# Patient Record
Sex: Female | Born: 2000 | Hispanic: No | Marital: Single | Smoking: Never smoker
Health system: Southern US, Community
[De-identification: ages and names within clinical notes are randomized; demographics above are authoritative.]

## PROBLEM LIST (undated history)

## (undated) ENCOUNTER — Inpatient Hospital Stay: Payer: Self-pay

## (undated) DIAGNOSIS — Z8639 Personal history of other endocrine, nutritional and metabolic disease: Secondary | ICD-10-CM

## (undated) HISTORY — PX: NO PAST SURGERIES: SHX2092

## (undated) HISTORY — DX: Personal history of other endocrine, nutritional and metabolic disease: Z86.39

---
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
## 2022-09-23 IMAGING — DX DG ABDOMEN 1V
2 series · 2 of 2 positions shown · non-contrast
Comparison: CT 01/11/2021

CLINICAL DATA: Mid abdominal pain and vomiting starting 2 days ago.

EXAM:
ABDOMEN - 1 VIEW

[abdomen supine (1 of 2)]
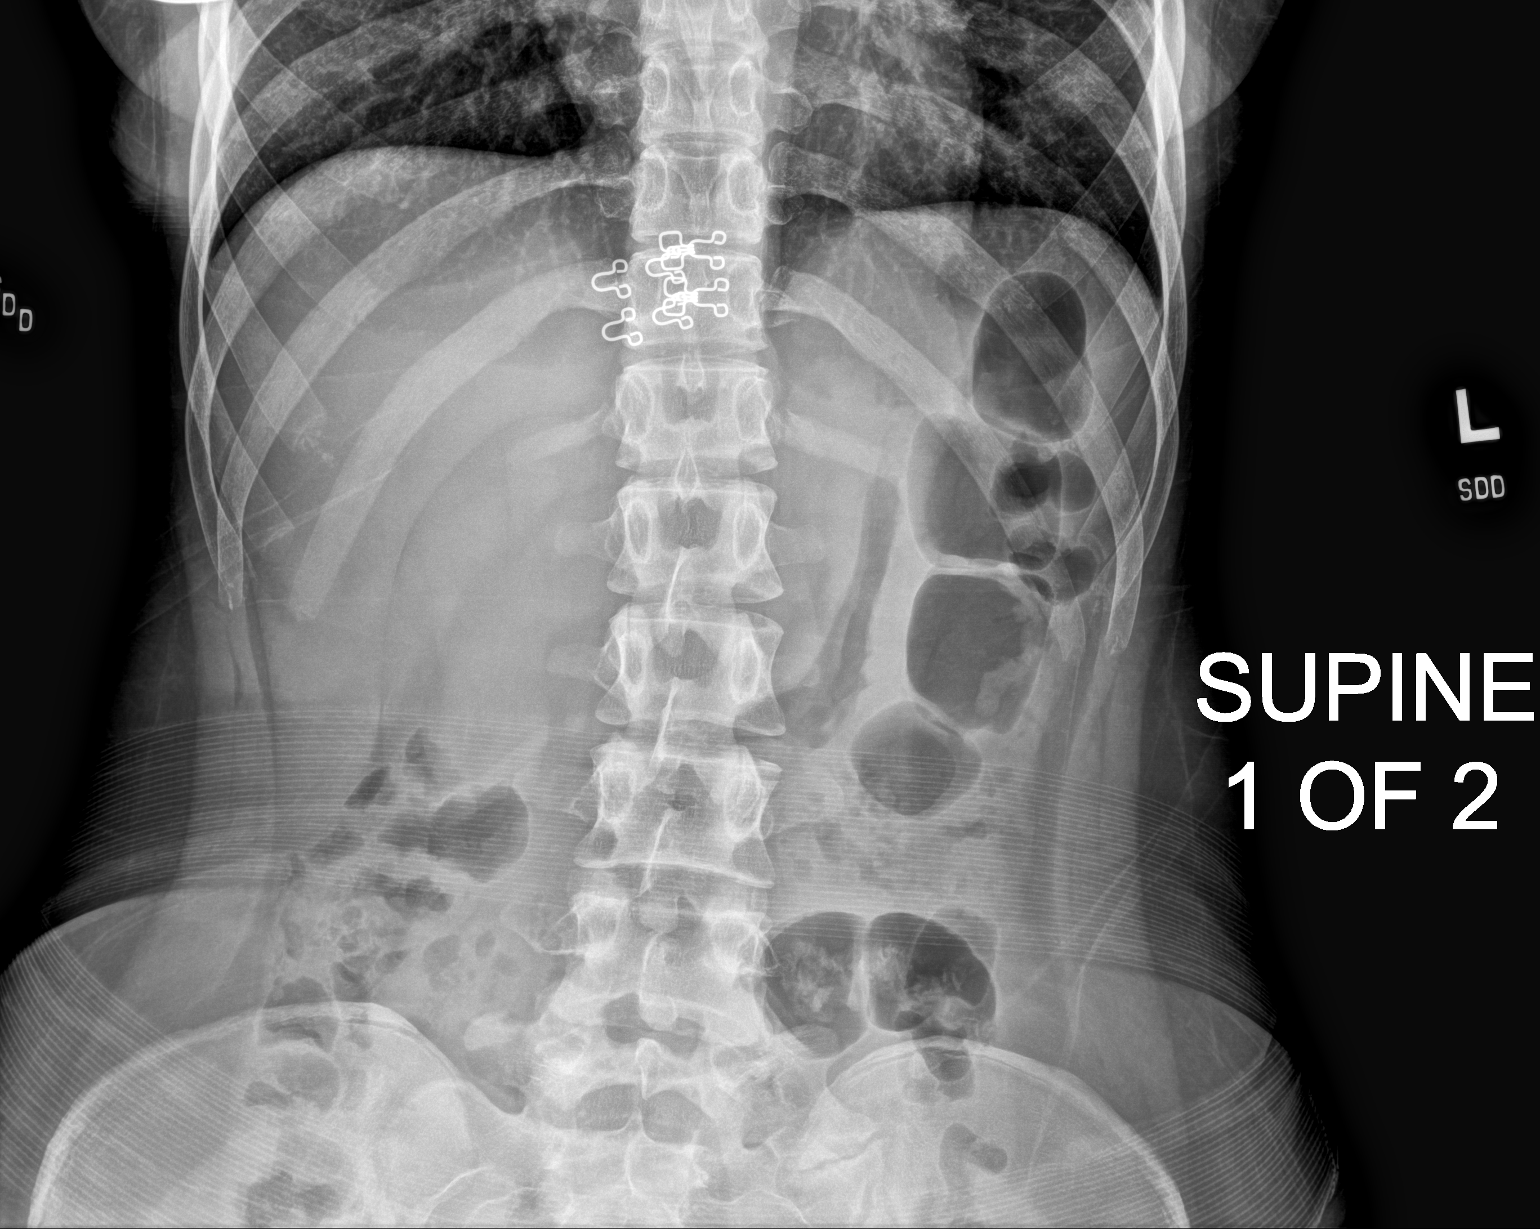

[abdomen supine (2 of 2)]
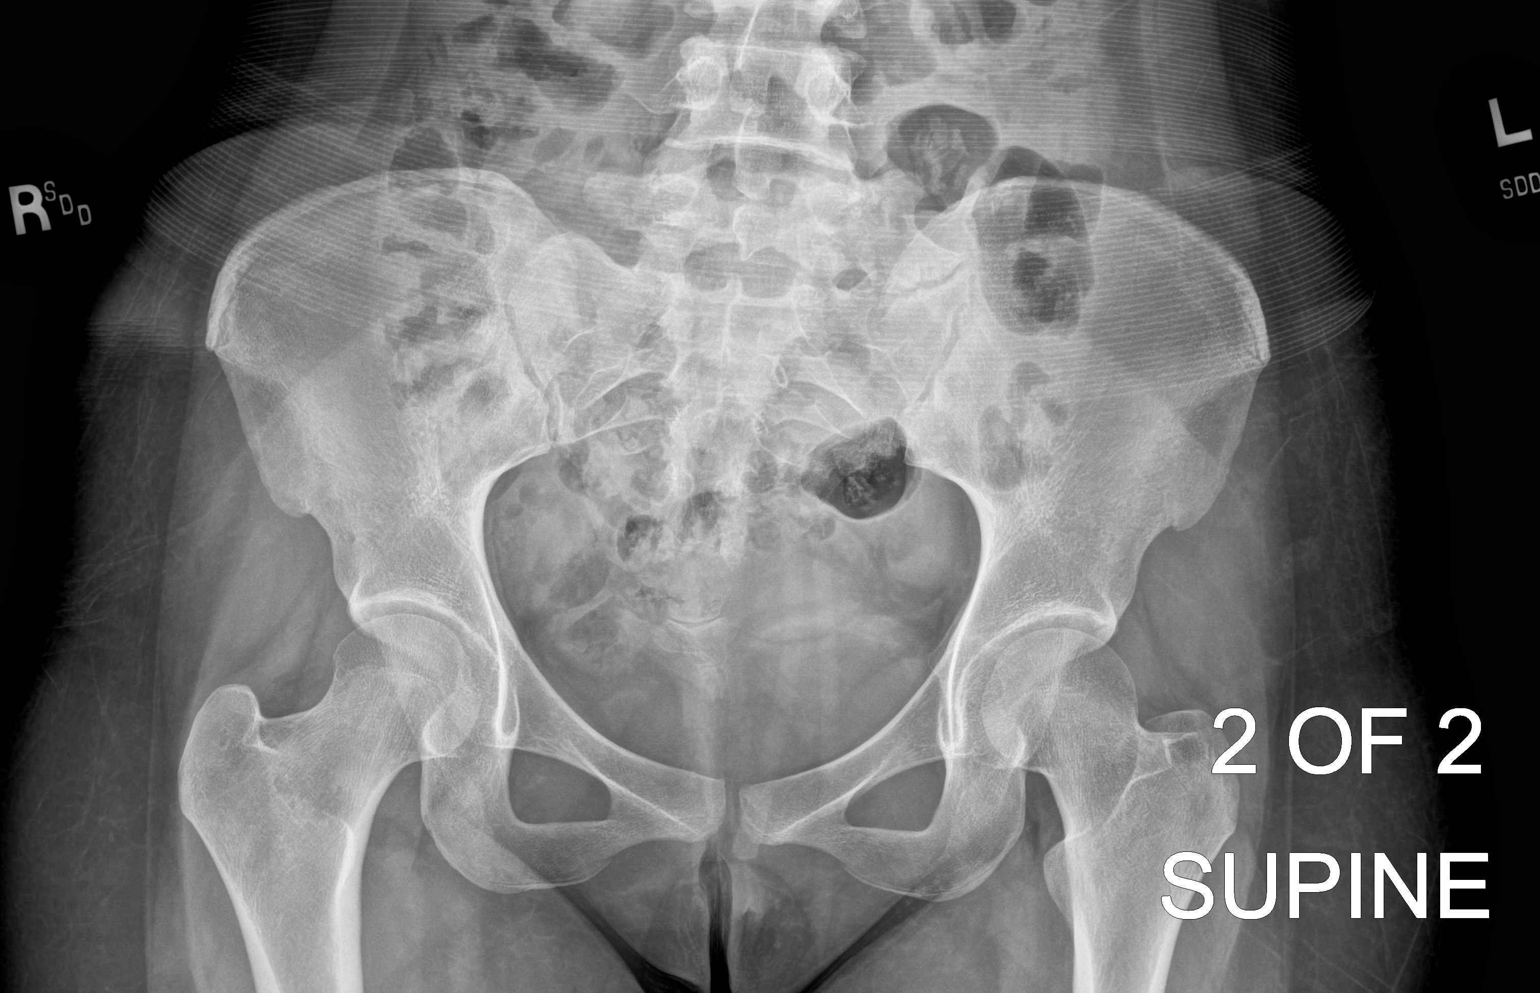

[2 of 2 positions shown; findings below may reference images not displayed]

FINDINGS: Gas and stool throughout the colon. No small or large bowel
distention. No radiopaque stones. Visualized bones and soft tissue
contours appear intact. Visualized lung bases are clear.
IMPRESSION: Normal nonobstructive bowel gas pattern.

## 2023-06-28 NOTE — L&D Delivery Note (Signed)
 Delivery Note   Kristina English is a 23 y.o. G1P0 at [redacted]w[redacted]d Estimated Date of Delivery: 04/09/24  PRE-OPERATIVE DIAGNOSIS:  1) [redacted]w[redacted]d pregnancy 2) Late entry to prenatal care  POST-OPERATIVE DIAGNOSIS:  1) [redacted]w[redacted]d pregnancy s/p Vaginal, Spontaneous 2) Late entry to prenatal care  Delivery Type: Vaginal, Spontaneous   Delivery Anesthesia: None  Labor Complications: Prolonged latent phase    ESTIMATED BLOOD LOSS: 150 ml    FINDINGS:   Information for the patient's newborn:  Hewitt, Girl Zaylia [[id number]]  Live born female  Birth Weight: 7 lb 8.3 oz (3410 g) APGAR: 6, 9  Newborn Delivery   Birth date/time: 04/12/2024 17:20:00 Delivery type: Vaginal, Spontaneous      SPECIMENS:   PLACENTA:   Appearance: Intact   Removal: Spontaneous     Disposition: discarded  Cord Blood: not collected, mother of baby A POS Performed at [facility], [address] [place], [place] [address]   DISPOSITION:  Infant left in stable condition in the delivery room, with L&D personnel and mother,  NARRATIVE SUMMARY: Labor course:  [name] is a G1P0 at [redacted]w[redacted]d who presented to Labor & Delivery for labor management. Her initial cervical exam was 4/80/-2. Labor proceeded with augmentation with AROM and pitocin and she was found to be completely dilated at 1610. With excellent maternal pushing effort, she birthed a viable female infant at 31. Head birthed ROA, restituted to ROT. There was a nuchal cord x 1 and reduced before delivery. The shoulders were birthed without difficulty. The infant was placed skin-to-skin with [name]. Pitocin was bolused in IV fluid for third stage management. The placenta delivered spontaneously and was noted to be intact with a 3VC. Cord was doubly clamped and cut by Dorina's mother. A perineal and vaginal examination was performed. Episiotomy/Lacerations: Bilateral Labial;Vaginal Lacerations were repaired with Vicryl suture using 1% lidocaine without  epinephrine anesthesia. Tolerated well. Mother and baby left in stable condition.  Lauraine Lakes, CNM 04/12/2024 5:59 PM

## 2023-08-28 DIAGNOSIS — Z789 Other specified health status: Secondary | ICD-10-CM | POA: Insufficient documentation

## 2023-09-28 ENCOUNTER — Other Ambulatory Visit: Payer: Self-pay | Admitting: Family Medicine

## 2023-09-28 DIAGNOSIS — Z3401 Encounter for supervision of normal first pregnancy, first trimester: Secondary | ICD-10-CM

## 2023-11-24 ENCOUNTER — Emergency Department

## 2023-11-24 ENCOUNTER — Other Ambulatory Visit: Payer: Self-pay

## 2023-11-24 ENCOUNTER — Encounter: Payer: Self-pay | Admitting: Emergency Medicine

## 2023-11-24 ENCOUNTER — Emergency Department
Admission: EM | Admit: 2023-11-24 | Discharge: 2023-11-25 | Disposition: A | Discharge status: Home / Self Care | Attending: Emergency Medicine | Admitting: Emergency Medicine

## 2023-11-24 DIAGNOSIS — O99891 Other specified diseases and conditions complicating pregnancy: Secondary | ICD-10-CM | POA: Insufficient documentation

## 2023-11-24 DIAGNOSIS — Z3492 Encounter for supervision of normal pregnancy, unspecified, second trimester: Secondary | ICD-10-CM

## 2023-11-24 DIAGNOSIS — Z3A19 19 weeks gestation of pregnancy: Secondary | ICD-10-CM | POA: Diagnosis not present

## 2023-11-24 DIAGNOSIS — R1013 Epigastric pain: Secondary | ICD-10-CM | POA: Insufficient documentation

## 2023-11-24 DIAGNOSIS — O2342 Unspecified infection of urinary tract in pregnancy, second trimester: Secondary | ICD-10-CM | POA: Insufficient documentation

## 2023-11-24 DIAGNOSIS — R079 Chest pain, unspecified: Secondary | ICD-10-CM | POA: Insufficient documentation

## 2023-11-24 DIAGNOSIS — N390 Urinary tract infection, site not specified: Secondary | ICD-10-CM

## 2023-11-24 LAB — CBC
HCT: 35 % — ABNORMAL LOW (ref 36.0–46.0)
Hemoglobin: 11.8 g/dL — ABNORMAL LOW (ref 12.0–15.0)
MCH: 30.7 pg (ref 26.0–34.0)
MCHC: 33.7 g/dL (ref 30.0–36.0)
MCV: 91.1 fL (ref 80.0–100.0)
Platelets: 304 10*3/uL (ref 150–400)
RBC: 3.84 MIL/uL — ABNORMAL LOW (ref 3.87–5.11)
RDW: 13.3 % (ref 11.5–15.5)
WBC: 11.1 10*3/uL — ABNORMAL HIGH (ref 4.0–10.5)
nRBC: 0 % (ref 0.0–0.2)

## 2023-11-24 LAB — BASIC METABOLIC PANEL WITH GFR
Anion gap: 8 (ref 5–15)
BUN: 5 mg/dL — ABNORMAL LOW (ref 6–20)
CO2: 22 mmol/L (ref 22–32)
Calcium: 9.3 mg/dL (ref 8.9–10.3)
Chloride: 105 mmol/L (ref 98–111)
Creatinine, Ser: 0.44 mg/dL (ref 0.44–1.00)
GFR, Estimated: >60 mL/min (ref >60)
Glucose, Bld: 82 mg/dL (ref 70–99)
Potassium: 3.4 mmol/L — ABNORMAL LOW (ref 3.5–5.1)
Sodium: 135 mmol/L (ref 135–145)

## 2023-11-24 LAB — TROPONIN I (HIGH SENSITIVITY): Troponin I (High Sensitivity): 3 ng/L (ref <18)

## 2023-11-24 MED ORDER — FAMOTIDINE IN NACL 20-0.9 MG/50ML-% IV SOLN
20.0000 mg | Freq: Once | INTRAVENOUS | Status: AC
  Administered 2023-11-25: 20 mg via INTRAVENOUS
  Filled 2023-11-24: qty 50

## 2023-11-24 NOTE — ED Triage Notes (Signed)
 Pt presents recently found out she was pregnant. For the past two days has had chest pressure underneath both breasts which is keeping her form sleeping.  Has had some nausea and vomiting.  LMP 1/7.

## 2023-11-24 NOTE — ED Provider Notes (Signed)
 [facility] Provider Note    Event Date/Time   First MD Initiated Contact with Patient 11/24/23 2326     (approximate)   History   Chest Pain and Pregnancy   HPI  Kristina English is a 22 y.o. female G1, P0 unknown dates, LMP in January who recently found out she was pregnant complaining of a 2-day history of pressure beneath her xiphoid process/epigastrium, waxing/waning unrelated to food.  Endorses some nausea/vomiting prior to onset of discomfort.  Denies fever/chills, shortness of breath, dysuria, vaginal bleeding or diarrhea.     Past Medical History  History reviewed. No pertinent past medical history.   Active Problem List  There are no active problems to display for this patient.    Past Surgical History  History reviewed. No pertinent surgical history.   Home Medications   Prior to Admission medications   Medication Sig Start Date End Date Taking? Authorizing Provider  dicyclomine  (BENTYL ) 10 MG capsule Take 1 capsule (10 mg total) by mouth 4 (four) times daily for 14 days. 04/06/21 04/20/21  Isa Manuel, MD  ondansetron  (ZOFRAN  ODT) 4 MG disintegrating tablet Take 1 tablet (4 mg total) by mouth every 8 (eight) hours as needed. 04/06/21   Isa Manuel, MD  potassium chloride  SA (KLOR-CON  M20) 20 MEQ tablet Take 1 tablet (20 mEq total) by mouth daily. 01/11/21   Lynnda Sas, MD     Allergies  Patient has no known allergies.   Family History  History reviewed. No pertinent family history.   Physical Exam  Triage Vital Signs: ED Triage Vitals  Encounter Vitals Group     BP 11/24/23 2145 114/69     Systolic BP Percentile --      Diastolic BP Percentile --      Pulse Rate 11/24/23 2145 77     Resp 11/24/23 2145 18     Temp 11/24/23 2146 97.8 F (36.6 C)     Temp Source 11/24/23 2146 Oral     SpO2 11/24/23 2145 100 %     Weight --      Height --      Head Circumference --      Peak Flow --      Pain Score  11/24/23 2142 4     Pain Loc --      Pain Education --      Exclude from Growth Chart --     Updated Vital Signs: BP (!) 116/56   Pulse (!) 101   Temp 97.8 F (36.6 C) (Oral)   Resp 20   SpO2 100%    General: Awake, no distress.  CV:  RRR.  Good peripheral perfusion.  Resp:  Normal effort.  CTAB. Abd:  Mild tenderness to epigastrium without rebound or guarding.  No distention.  Other:  No truncal vesicles.   ED Results / Procedures / Treatments  Labs (all labs ordered are listed, but only abnormal results are displayed) Labs Reviewed  BASIC METABOLIC PANEL WITH GFR - Abnormal; Notable for the following components:      Result Value   Potassium 3.4 (*)    BUN 5 (*)    All other components within normal limits  CBC - Abnormal; Notable for the following components:   WBC 11.1 (*)    RBC 3.84 (*)    Hemoglobin 11.8 (*)    HCT 35.0 (*)    All other components within normal limits  HCG, QUANTITATIVE, PREGNANCY - Abnormal; Notable for the following  components:   hCG, Beta Chain, Quant, [place] 16,109 (*)    All other components within normal limits  HEPATIC FUNCTION PANEL - Abnormal; Notable for the following components:   Albumin 3.3 (*)    All other components within normal limits  URINALYSIS, ROUTINE W REFLEX MICROSCOPIC - Abnormal; Notable for the following components:   Color, Urine STRAW (*)    APPearance HAZY (*)    Specific Gravity, Urine 1.002 (*)    Leukocytes,Ua LARGE (*)    Bacteria, UA MANY (*)    All other components within normal limits  URINE CULTURE  LIPASE, BLOOD  TROPONIN I (HIGH SENSITIVITY)  TROPONIN I (HIGH SENSITIVITY)     EKG  ED ECG REPORT I, Tyran Huser J, the attending physician, personally viewed and interpreted this ECG.   Date: 11/25/2023  EKG Time: 2145  Rate: 77  Rhythm: normal sinus rhythm  Axis: Normal  Intervals:none  ST&T Change: Nonspecific    RADIOLOGY I have independently visualized and interpreted patient's imaging  studies as well as noted the radiology interpretation:  Chest x-ray: No acute cardiopulmonary process  US  OB limited: IUP 19-week 1 day, closed cervix  RUQ ultrasound: No acute findings  Official radiology report(s): US  ABDOMEN LIMITED RUQ (LIVER/GB) Result Date: 11/25/2023 EXAM: Right Upper Quadrant Abdominal Ultrasound 11/25/2023 12:13:35 AM TECHNIQUE: Real-time ultrasonography of the right upper quadrant of the abdomen was performed. COMPARISON: None available. CLINICAL HISTORY: Pain. FINDINGS: LIVER: The liver demonstrates normal echogenicity. No intrahepatic biliary ductal dilatation. No evidence of mass. BILIARY SYSTEM: No pericholecystic fluid or wall thickening. No cholelithiasis. Negative sonographic Murphy's sign. Common bile duct is within normal limits measuring 3 mm. OTHER: No right upper quadrant ascites. IMPRESSION: 1. No acute findings. Electronically signed by: Zadie Herter MD 11/25/2023 12:28 AM EDT RP Workstation: [id number]   US  OB Limited Result Date: 11/25/2023 EXAM: ULTRASOUND OB LIMITED CLINICAL HISTORY: Pain. TECHNIQUE: Transabdominal obstetric pelvic ultrasound was performed with color Doppler flow evaluation. COMPARISON: None provided. FINDINGS: FETUS: A single live intrauterine pregnancy is present, estimated gestational age [redacted] weeks 1 day. POSITION: The fetus position is cephalic. HEART RATE: Fetal heart rate measures 150 beats per minute. PLACENTA: The placenta is located anterior. No placenta previa. AMNIOTIC FLUID: The amniotic fluid volume is normal. BIOMETRICS: BPD measures 4.4 cm (19 weeks, 1 day). CERVIX: Cervix closed, measuring 4 cm. IMPRESSION: 1. Single live intrauterine pregnancy with gestational age of [redacted] weeks 1 day by BPD. 2. Closed cervix measuring 4 cm. Electronically signed by: Zadie Herter MD 11/25/2023 12:28 AM EDT RP Workstation: [id number]   DG Chest 2 View Result Date: 11/24/2023 CLINICAL DATA:  Chest pain. Nausea and vomiting. Recently  found out she was pregnant. EXAM: CHEST - 2 VIEW COMPARISON:  None Available. FINDINGS: The heart size and mediastinal contours are within normal limits. Both lungs are clear. The visualized skeletal structures are unremarkable. IMPRESSION: No active cardiopulmonary disease. Electronically Signed   By: Boyce Byes M.D.   On: 11/24/2023 22:24     PROCEDURES:  Critical Care performed: No  Procedures   MEDICATIONS ORDERED IN ED: Medications  famotidine  (PEPCID ) IVPB 20 mg premix (0 mg Intravenous Stopped 11/25/23 0056)  cefTRIAXone (ROCEPHIN) 1 g in sodium chloride 0.9 % 100 mL IVPB (1 g Intravenous New Bag/Given 11/25/23 0123)     IMPRESSION / MDM / ASSESSMENT AND PLAN / ED COURSE  I reviewed the triage vital signs and the nursing notes.  23 year old female G1, P0 unknown dates presenting with epigastric/substernal chest pressure. Differential diagnosis includes, but is not limited to, ACS, aortic dissection, pulmonary embolism, cardiac tamponade, pneumothorax, pneumonia, pericarditis, myocarditis, GI-related causes including esophagitis/gastritis, and musculoskeletal chest wall pain.   I personally reviewed patient's records and note an urgent care visit from 11/02/2023 for abdominal cramping and pregnancy.  Patient's presentation is most consistent with acute complicated illness / injury requiring diagnostic workup.  Laboratory results unremarkable.  Will check LFTs/lipase, UA.  Send for OB and RUQ ultrasounds.  Administer IV Pepcid  and reassess.  Clinical Course as of 11/25/23 0205  Sat Nov 25, 2023  0203 Ultrasound abdomen negative.  OB ultrasound demonstrates IUP 19-week 1 day.  LFT/lipase unremarkable.  Large leukocyte positive UTI.  Will place on Keflex and patient will follow-up with OB/GYN closely.  Strict return precautions given.  Patient verbalizes understanding and agrees with plan of care. [JS]    Clinical Course User Index [JS] Norlene Beavers,  MD     FINAL CLINICAL IMPRESSION(S) / ED DIAGNOSES   Final diagnoses:  Nonspecific chest pain  Second trimester pregnancy  Urinary tract infection without hematuria, site unspecified     Rx / DC Orders   ED Discharge Orders     None        Note:  This document was prepared using Dragon voice recognition software and may include unintentional dictation errors.   Murry Khiev J, MD 11/25/23 [phone]

## 2023-11-25 LAB — URINALYSIS, ROUTINE W REFLEX MICROSCOPIC
Bilirubin Urine: NEGATIVE
Glucose, UA: NEGATIVE mg/dL
Hgb urine dipstick: NEGATIVE
Ketones, ur: NEGATIVE mg/dL
Nitrite: NEGATIVE
Protein, ur: NEGATIVE mg/dL
Specific Gravity, Urine: 1.002 — ABNORMAL LOW (ref 1.005–1.030)
pH: 6 (ref 5.0–8.0)

## 2023-11-25 LAB — HEPATIC FUNCTION PANEL
ALT: 13 U/L (ref 0–44)
AST: 20 U/L (ref 15–41)
Albumin: 3.3 g/dL — ABNORMAL LOW (ref 3.5–5.0)
Alkaline Phosphatase: 44 U/L (ref 38–126)
Bilirubin, Direct: <0.1 mg/dL (ref 0.0–0.2)
Total Bilirubin: 0.4 mg/dL (ref 0.0–1.2)
Total Protein: 6.9 g/dL (ref 6.5–8.1)

## 2023-11-25 LAB — LIPASE, BLOOD: Lipase: 28 U/L (ref 11–51)

## 2023-11-25 LAB — TROPONIN I (HIGH SENSITIVITY): Troponin I (High Sensitivity): 4 ng/L (ref <18)

## 2023-11-25 LAB — HCG, QUANTITATIVE, PREGNANCY: hCG, Beta Chain, Quant, S: 36945 m[IU]/mL — ABNORMAL HIGH (ref <5)

## 2023-11-25 MED ORDER — ONDANSETRON 4 MG PO TBDP: 4.0000 mg | ORAL_TABLET | Freq: Three times a day (TID) | ORAL | 0 refills | Status: DC | PRN

## 2023-11-25 MED ORDER — CEPHALEXIN 500 MG PO CAPS: 500.0000 mg | ORAL_CAPSULE | Freq: Three times a day (TID) | ORAL | 0 refills | Status: DC

## 2023-11-25 MED ORDER — SODIUM CHLORIDE 0.9 % IV SOLN
1.0000 g | Freq: Once | INTRAVENOUS | Status: AC
  Administered 2023-11-25: 1 g via INTRAVENOUS
  Filled 2023-11-25: qty 10

## 2023-11-25 NOTE — Discharge Instructions (Signed)
 Take and finish antibiotic as prescribed.  You may take Zofran  as needed for nausea or vomiting.  Drink plenty of fluids daily.  Return to the ER for worsening symptoms, persistent vomiting or other concerns.

## 2023-11-26 LAB — URINE CULTURE: Culture: <10000 — AB

## 2023-12-01 ENCOUNTER — Telehealth

## 2023-12-01 DIAGNOSIS — Z3490 Encounter for supervision of normal pregnancy, unspecified, unspecified trimester: Secondary | ICD-10-CM | POA: Insufficient documentation

## 2023-12-01 DIAGNOSIS — Z3401 Encounter for supervision of normal first pregnancy, first trimester: Secondary | ICD-10-CM | POA: Insufficient documentation

## 2023-12-01 DIAGNOSIS — Z3689 Encounter for other specified antenatal screening: Secondary | ICD-10-CM | POA: Diagnosis not present

## 2023-12-01 DIAGNOSIS — Z3400 Encounter for supervision of normal first pregnancy, unspecified trimester: Secondary | ICD-10-CM | POA: Insufficient documentation

## 2023-12-01 DIAGNOSIS — Z3480 Encounter for supervision of other normal pregnancy, unspecified trimester: Secondary | ICD-10-CM | POA: Insufficient documentation

## 2023-12-01 DIAGNOSIS — O0930 Supervision of pregnancy with insufficient antenatal care, unspecified trimester: Secondary | ICD-10-CM | POA: Insufficient documentation

## 2023-12-01 NOTE — Patient Instructions (Signed)
 Second Trimester of Pregnancy  The second trimester of pregnancy is from week 14 through week 27. This is months 4 through 6 of pregnancy. During the second trimester: Morning sickness is less or has stopped. You may have more energy. You may feel hungry more often. At this time, your unborn baby is growing very fast. At the end of the sixth month, the unborn baby may be up to 12 inches long and weigh about 1 pounds. You will likely start to feel the baby move between 16 and 20 weeks of pregnancy. Body changes during your second trimester Your body continues to change during this time. The changes usually go away after your baby is born. Physical changes You will gain more weight. Your belly will get bigger. You may begin to get stretch marks on your hips, belly, and breasts. Your breasts will keep growing and may hurt. You may get dark spots or blotches on your face. A dark line from your belly button to the pubic area may appear. This line is called linea nigra. Your hair may grow faster and get thicker. Health changes You may have headaches. You may have heartburn. You may pee more often. You may have swollen, bulging veins (varicose veins). You may have trouble pooping (constipation), or swollen veins in the butt that can itch or get painful (hemorrhoids). You may have back pain. This is caused by: Weight gain. Pregnancy hormones that are relaxing the joints in your pelvis. Follow these instructions at home: Medicines Talk to your health care provider if you're taking medicines. Ask if the medicines are safe to take during pregnancy. Your provider may change the medicines that you take. Do not take any medicines unless told to by your provider. Take a prenatal vitamin that has at least 600 micrograms (mcg) of folic acid. Do not use herbal medicines, illegal drugs, or medicines that are not approved by your provider. Eating and drinking While you're pregnant your body needs  extra food for your growing baby. Talk with your provider about what to eat while pregnant. Activity Most women are able to exercise during pregnancy. Exercises may need to change as your pregnancy goes on. Talk to your provider about your activities and exercise routines. Relieving pain and discomfort Wear a good, supportive bra if your breasts hurt. Rest with your legs raised if you have leg cramps or low back pain. Take warm sitz baths to soothe pain from hemorrhoids. Use hemorrhoid cream if your provider says it's okay. Do not douche. Do not use tampons or scented pads. Do not use hot tubs, steam rooms, or saunas. Safety Wear your seatbelt at all times when you're in a car. Talk to your provider if someone hits you, hurts you, or yells at you. Talk with your provider if you're feeling sad or have thoughts of hurting yourself. Lifestyle Certain things can be harmful while you're pregnant. It's best to avoid the following: Do not drink alcohol,smoke, vape, or use products with nicotine or tobacco in them. If you need help quitting, talk with your provider. Avoid cat litter boxes and soil used by cats. These things carry germs that can cause harm to your pregnancy and your baby. General instructions Keep all follow-up visits. It helps you and your unborn baby stay as healthy as possible. Write down your questions. Take them to your prenatal visits. Your provider will: Talk with you about your overall health. Give you advice or refer you to specialists who can help with different needs,  including: Prenatal education classes. Mental health and counseling. Foods and healthy eating. Ask for help if you need help with food. Where to find more information American Pregnancy Association: americanpregnancy.org [employer] of [employer]etricians and Gynecologists: acog.org Office on [employer] Health: TravelLess[employer] Contact a health care provider if: You have a headache that does not go away  when you take medicine. You have any of these problems: You can't eat or drink. You throw up or feel like you may throw up. You have watery poop (diarrhea) for 2 days or more. You have pain when you pee or your pee smells bad. You have been sick for 2 days or more and are not getting better. Contact your provider right away if: You have any of these coming from your vagina: Abnormal discharge. Bad-smelling fluid. Bleeding. Your baby is moving less than usual. You have contractions, belly cramping, or have pain in your pelvis or lower back. You have symptoms of high blood pressure or preeclampsia. These include: A severe, throbbing headache that does not go away. Sudden or extreme swelling of your face, hands, legs, or feet. Vision problems: You see spots. You have blurry vision. Your eyes are sensitive to light. If you can't reach the provider, go to an urgent care or emergency room. Get help right away if: You faint, become confused, or can't think clearly. You have chest pain or trouble breathing. You have any kind of injury, such as from a fall or a car crash. These symptoms may be an emergency. Call 911 right away. Do not wait to see if the symptoms will go away. Do not drive yourself to the hospital. This information is not intended to replace advice given to you by your health care provider. Make sure you discuss any questions you have with your health care provider. Document Revised: 03/16/2023 Document Reviewed: 10/14/2022 Elsevier Patient Education  2024 Elsevier Inc.   Common Medications Safe in Pregnancy  Acne:      Constipation:  Benzoyl Peroxide     Colace  Clindamycin      Dulcolax Suppository  Topica Erythromycin     Fibercon  Salicylic Acid      Metamucil         Miralax AVOID:        Senakot   Accutane    Cough:  Retin-A       Cough Drops  Tetracycline      Phenergan w/ Codeine if Rx  Minocycline      Robitussin (Plain &  DM)  Antibiotics:     Crabs/Lice:  Ceclor       RID  Cephalosporins    AVOID:  E-Mycins      Kwell  Keflex  Macrobid/Macrodantin   Diarrhea:  Penicillin      Kao-Pectate  Zithromax      Imodium AD         PUSH FLUIDS AVOID:       Cipro     Fever:  Tetracycline      Tylenol (Regular or Extra  Minocycline       Strength)  Levaquin      Extra Strength-Do not          Exceed 8 tabs/24 hrs Caffeine:        200mg /day (equiv. To 1 cup of coffee or  approx. 3 12 oz sodas)         Gas: Cold/Hayfever:       Gas-X  Benadryl      Mylicon  Claritin       Phazyme  **Claritin-D        Chlor-Trimeton    Headaches:  Dimetapp      ASA-Free Excedrin  Drixoral-Non-Drowsy     Cold Compress  Mucinex (Guaifenasin)     Tylenol (Regular or Extra  Sudafed/Sudafed-12 Hour     Strength)  **Sudafed PE Pseudoephedrine   Tylenol Cold & Sinus     Vicks Vapor Rub  Zyrtec  **AVOID if Problems With Blood Pressure         Heartburn: Avoid lying down for at least 1 hour after meals  Aciphex      Maalox     Rash:  Milk of Magnesia     Benadryl    Mylanta       1% Hydrocortisone Cream  Pepcid  Pepcid Complete   Sleep Aids:  Prevacid      Ambien   Prilosec       Benadryl  Rolaids       Chamomile Tea  Tums (Limit 4/day)     Unisom         Tylenol PM         Warm milk-add vanilla or  Hemorrhoids:       Sugar for taste  Anusol/Anusol H.C.  (RX: Analapram 2.5%)  Sugar Substitutes:  Hydrocortisone OTC     Ok in moderation  Preparation H      Tucks        Vaseline lotion applied to tissue with wiping    Herpes:     Throat:  Acyclovir      Oragel  Famvir  Valtrex     Vaccines:         Flu Shot Leg Cramps:       *Gardasil  Benadryl      Hepatitis A         Hepatitis B Nasal Spray:       Pneumovax  Saline Nasal Spray     Polio Booster         Tetanus Nausea:       Tuberculosis test or PPD  Vitamin B6 25 mg TID   AVOID:    Dramamine      *Gardasil  Emetrol       Live Poliovirus  Ginger  Root 250 mg QID    MMR (measles, mumps &  High Complex Carbs @ Bedtime    rebella)  Sea Bands-Accupressure    Varicella (Chickenpox)  Unisom 1/2 tab TID     *No known complications           If received before Pain:         Known pregnancy;   Darvocet       Resume series after  Lortab        Delivery  Percocet    Yeast:   Tramadol      Femstat  Tylenol 3      Gyne-lotrimin  Ultram       Monistat  Vicodin           MISC:         All Sunscreens           Hair Coloring/highlights          Insect Repellant's          (Including DEET)         Mystic Tans   Commonly Asked Questions During Pregnancy  Cats: A parasite can be excreted in cat feces.  To avoid exposure you need to have another person empty the little box.  If you must empty the litter box you will need to wear gloves.  Wash your hands after handling your cat.  This parasite can also be found in raw or undercooked meat so this should also be avoided.  Colds, Sore Throats, Flu: Please check your medication sheet to see what you can take for symptoms.  If your symptoms are unrelieved by these medications please call the office.  Dental Work: Most any dental work Agricultural consultant recommends is permitted.  X-rays should only be taken during the first trimester if absolutely necessary.  Your abdomen should be shielded with a lead apron during all x-rays.  Please notify your provider prior to receiving any x-rays.  Novocaine is fine; gas is not recommended.  If your dentist requires a note from [place] prior to dental work please call the office and we will provide one for you.  Exercise: Exercise is an important part of staying healthy during your pregnancy.  You may continue most exercises you were accustomed to prior to pregnancy.  Later in your pregnancy you will most likely notice you have difficulty with activities requiring balance like riding a bicycle.  It is important that you listen to your body and avoid activities that put you at a  higher risk of falling.  Adequate rest and staying well hydrated are a must!  If you have questions about the safety of specific activities ask your provider.    Exposure to Children with illness: Try to avoid obvious exposure; report any symptoms to [place] when noted,  If you have chicken pos, red measles or mumps, you should be immune to these diseases.   Please do not take any vaccines while pregnant unless you have checked with your OB provider.  Fetal Movement: After 28 weeks we recommend you do "kick counts" twice daily.  Lie or sit down in a calm quiet environment and count your baby movements "kicks".  You should feel your baby at least 10 times per hour.  If you have not felt 10 kicks within the first hour get up, walk around and have something sweet to eat or drink then repeat for an additional hour.  If count remains less than 10 per hour notify your provider.  Fumigating: Follow your pest control agent's advice as to how long to stay out of your home.  Ventilate the area well before re-entering.  Hemorrhoids:   Most over-the-counter preparations can be used during pregnancy.  Check your medication to see what is safe to use.  It is important to use a stool softener or fiber in your diet and to drink lots of liquids.  If hemorrhoids seem to be getting worse please call the office.   Hot Tubs:  Hot tubs Jacuzzis and saunas are not recommended while pregnant.  These increase your internal body temperature and should be avoided.  Intercourse:  Sexual intercourse is safe during pregnancy as long as you are comfortable, unless otherwise advised by your provider.  Spotting may occur after intercourse; report any bright red bleeding that is heavier than spotting.  Labor:  If you know that you are in labor, please go to the hospital.  If you are unsure, please call the office and let us help you decide what to do.  Lifting, straining, etc:  If your job requires heavy lifting or straining please check  with your provider for any limitations.  Generally, you should  not lift items heavier than that you can lift simply with your hands and arms (no back muscles)  Painting:  Paint fumes do not harm your pregnancy, but may make you ill and should be avoided if possible.  Latex or water based paints have less odor than oils.  Use adequate ventilation while painting.  Permanents & Hair Color:  Chemicals in hair dyes are not recommended as they cause increase hair dryness which can increase hair loss during pregnancy.  " Highlighting" and permanents are allowed.  Dye may be absorbed differently and permanents may not hold as well during pregnancy.  Sunbathing:  Use a sunscreen, as skin burns easily during pregnancy.  Drink plenty of fluids; avoid over heating.  Tanning Beds:  Because their possible side effects are still unknown, tanning beds are not recommended.  Ultrasound Scans:  Routine ultrasounds are performed at approximately 20 weeks.  You will be able to see your baby's general anatomy an if you would like to know the gender this can usually be determined as well.  If it is questionable when you conceived you may also receive an ultrasound early in your pregnancy for dating purposes.  Otherwise ultrasound exams are not routinely performed unless there is a medical necessity.  Although you can request a scan we ask that you pay for it when conducted because insurance does not cover " patient request" scans.  Work: If your pregnancy proceeds without complications you may work until your due date, unless your physician or employer advises otherwise.  Round Ligament Pain/Pelvic Discomfort:  Sharp, shooting pains not associated with bleeding are fairly common, usually occurring in the second trimester of pregnancy.  They tend to be worse when standing up or when you remain standing for long periods of time.  These are the result of pressure of certain pelvic ligaments called "round ligaments".  Rest,  Tylenol and heat seem to be the most effective relief.  As the womb and fetus grow, they rise out of the pelvis and the discomfort improves.  Please notify the office if your pain seems different than that described.  It may represent a more serious condition.

## 2023-12-01 NOTE — Progress Notes (Signed)
 New OB Intake  I connected with  Kristina English on 12/01/23 at  2:35 PM EDT by MyChart Video Visit and verified that I am speaking with the correct person using two identifiers. Nurse is located at [facility] and pt is located at home.  I discussed the limitations, risks, security and privacy concerns of performing an evaluation and management service by telephone and the availability of in person appointments. I also discussed with the patient that there may be a patient responsible charge related to this service. The patient expressed understanding and agreed to proceed.  I explained I am completing New OB Intake today. We discussed her EDD of 04/18/24 that is based on Ultrasound. Pt is G1/P0. I reviewed her allergies, medications, Medical/Surgical/OB history, and appropriate screenings. There are cats in the home: no. Based on history, this is a/an pregnancy uncomplicated . Her obstetrical history is significant for N/A.  Patient Active Problem List   Diagnosis Date Noted   Supervision of other normal pregnancy, antepartum 12/01/2023   Late prenatal care 12/01/2023    Concerns addressed today: None  Delivery Plans:  Plans to deliver at [facility].  Anatomy US  Explained Anatomy US  will be scheduled around [redacted] weeks gestational age.  Labs Discussed genetic screening with patient. Patient desires genetic testing to be drawn at new OB visit. Discussed possible labs to be drawn at new OB appointment.  COVID Vaccine Patient has not had COVID vaccine.   Social Determinants of Health Food Insecurity: expresses food insecurity. Information given on local food banks. Transportation: Patient denies transportation needs. Childcare: Discussed no children allowed at ultrasound appointments.   First visit review I reviewed new OB appt with pt. I explained she will have blood work and pap smear/pelvic exam if indicated. Explained pt will be seen by Donato Fu, CNM  at first visit; encounter routed to appropriate provider.   Higinio Love, CMA 12/01/2023  3:52 PM
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
## 2023-12-06 ENCOUNTER — Ambulatory Visit (INDEPENDENT_AMBULATORY_CARE_PROVIDER_SITE_OTHER): Admitting: Certified Nurse Midwife

## 2023-12-06 VITALS — BP 110/66 | HR 87 | Wt 127.7 lb

## 2023-12-06 DIAGNOSIS — Z3A20 20 weeks gestation of pregnancy: Secondary | ICD-10-CM

## 2023-12-06 DIAGNOSIS — Z1329 Encounter for screening for other suspected endocrine disorder: Secondary | ICD-10-CM

## 2023-12-06 DIAGNOSIS — Z3402 Encounter for supervision of normal first pregnancy, second trimester: Secondary | ICD-10-CM

## 2023-12-06 DIAGNOSIS — Z113 Encounter for screening for infections with a predominantly sexual mode of transmission: Secondary | ICD-10-CM

## 2023-12-06 DIAGNOSIS — Z1379 Encounter for other screening for genetic and chromosomal anomalies: Secondary | ICD-10-CM

## 2023-12-06 DIAGNOSIS — Z0283 Encounter for blood-alcohol and blood-drug test: Secondary | ICD-10-CM

## 2023-12-06 DIAGNOSIS — Z131 Encounter for screening for diabetes mellitus: Secondary | ICD-10-CM

## 2023-12-06 DIAGNOSIS — J4520 Mild intermittent asthma, uncomplicated: Secondary | ICD-10-CM

## 2023-12-06 DIAGNOSIS — Z3401 Encounter for supervision of normal first pregnancy, first trimester: Secondary | ICD-10-CM

## 2023-12-06 DIAGNOSIS — J45909 Unspecified asthma, uncomplicated: Secondary | ICD-10-CM | POA: Insufficient documentation

## 2023-12-06 NOTE — Addendum Note (Signed)
 Addended by: Donato Fu on: 12/06/2023 03:59 PM   Modules accepted: Orders

## 2023-12-14 ENCOUNTER — Ambulatory Visit (INDEPENDENT_AMBULATORY_CARE_PROVIDER_SITE_OTHER)

## 2023-12-14 ENCOUNTER — Encounter: Payer: Self-pay | Admitting: Licensed Practical Nurse

## 2023-12-14 DIAGNOSIS — Z3A20 20 weeks gestation of pregnancy: Secondary | ICD-10-CM

## 2023-12-14 DIAGNOSIS — Z3402 Encounter for supervision of normal first pregnancy, second trimester: Secondary | ICD-10-CM

## 2023-12-14 DIAGNOSIS — Z3401 Encounter for supervision of normal first pregnancy, first trimester: Secondary | ICD-10-CM

## 2024-01-03 ENCOUNTER — Encounter: Payer: Self-pay | Admitting: Licensed Practical Nurse

## 2024-01-03 ENCOUNTER — Ambulatory Visit (INDEPENDENT_AMBULATORY_CARE_PROVIDER_SITE_OTHER): Admitting: Licensed Practical Nurse

## 2024-01-03 VITALS — BP 105/69 | HR 83 | Wt 134.5 lb

## 2024-01-03 DIAGNOSIS — Z3401 Encounter for supervision of normal first pregnancy, first trimester: Secondary | ICD-10-CM

## 2024-01-03 DIAGNOSIS — Z3A26 26 weeks gestation of pregnancy: Secondary | ICD-10-CM | POA: Insufficient documentation

## 2024-01-03 DIAGNOSIS — Z113 Encounter for screening for infections with a predominantly sexual mode of transmission: Secondary | ICD-10-CM

## 2024-01-03 DIAGNOSIS — Z3402 Encounter for supervision of normal first pregnancy, second trimester: Secondary | ICD-10-CM | POA: Diagnosis not present

## 2024-01-03 DIAGNOSIS — Z130 Encounter for screening for diseases of the blood and blood-forming organs and certain disorders involving the immune mechanism: Secondary | ICD-10-CM

## 2024-01-03 DIAGNOSIS — Z363 Encounter for antenatal screening for malformations: Secondary | ICD-10-CM

## 2024-01-03 DIAGNOSIS — O324XX9 Maternal care for high head at term, other fetus: Secondary | ICD-10-CM

## 2024-01-03 DIAGNOSIS — Z131 Encounter for screening for diabetes mellitus: Secondary | ICD-10-CM

## 2024-01-03 NOTE — Patient Instructions (Signed)
 Oral Glucose Tolerance Test During Pregnancy Why am I having this test? The oral glucose tolerance test (OGTT) is done to check how your body uses blood sugar, also called glucose. It's one of many tests used to diagnose the type of diabetes you can get while pregnant. This type of diabetes is called gestational diabetes mellitus (GDM). You may get GDM during the middle part of your pregnancy. In most cases, it goes away after you give birth. Most people get tested for GDM around weeks 24-28 of pregnancy. You may have the test sooner if: You or your mother had diabetes while pregnant. A person in your family has diabetes. You're having more than one baby this pregnancy. You've had a baby before who weighed more than 9 pounds (4 kg) at birth. You have high blood pressure or heart disease. You have a large body. You're not active. What is being tested? This test measures your blood sugar at different times. It shows how well your body uses the sugar in your blood. What kind of sample is taken?  A sample of blood is needed for this test. The sample is taken by putting a needle into a blood vessel. How do I prepare for this test? Eat your normal meals the day before the test. Your health care provider will tell you about: Eating or drinking on the day of the test. You may need to fast for 8-10 hours before the test. When you fast, you can only have water. Changing or stopping your regular medicines. Some medicines may affect your test results. Tell a health care provider about: All medicines you take. These include vitamins, herbs, eye drops, and creams. What happens during the test? The test involves these steps: Your blood sugar will be checked. It's called your fasting blood sugar if you fasted before the test. You'll drink a sugary mixture. Your blood sugar will be checked again. For a 1-hour test, it will be checked after an hour. For a 3-hour test, it will be checked 1, 2, and 3 hours  after you drink the sugary mixture. The test takes 1-3 hours. You'll need to stay at the testing place during this time. During the testing time: Do not eat or drink anything after the sugary drink. Do not exercise. Do not use any products that contain nicotine or tobacco. These products include cigarettes, chewing tobacco, and vaping devices, such as e-cigarettes. The test may vary among providers and hospitals. How are the results reported? Your provider will compare your results to normal values for the kind of test that you had done. You may need to call or meet with your provider to get your results. What do the results mean? Your provider can tell you what blood sugar levels are normal for the test you're doing. If two or more of your blood sugar levels are at or above normal, you may be diagnosed with GDM. If only one level is high, your provider may suggest: Doing the test again. Doing other tests to confirm a diagnosis. Talk with your provider about what your results mean. Questions to ask your health care provider Ask your provider, or the department doing the test: When will my results be ready? How will I get my results? What are my next steps? This information is not intended to replace advice given to you by your health care provider. Make sure you discuss any questions you have with your health care provider. Document Revised: 10/17/2022 Document Reviewed: 10/17/2022 Elsevier Patient Education  2024 Elsevier Inc.

## 2024-01-03 NOTE — Progress Notes (Signed)
 [name]

## 2024-01-03 NOTE — Progress Notes (Signed)
    Return Prenatal Note   Subjective   23 y.o. G1P0 at [redacted]w[redacted]d presents for this follow-up prenatal visit.  Patient here with mother today. Recent transfer from health department.  Patient reports: doing well, no concerns.  Movement: Present Contractions: Not present  Objective   Flow sheet Vitals: Pulse Rate: 83 BP: 105/69 Fundal Height: 27 cm Fetal Heart Rate (bpm): 140 Total weight gain: 20 lb 8 oz (9.299 kg)  General Appearance  No acute distress, well appearing, and well nourished Pulmonary   Normal work of breathing Neurologic   Alert and oriented to person, place, and time Psychiatric   Mood and affect within normal limits  Assessment/Plan   Plan  23 y.o. G1P0 at [redacted]w[redacted]d presents for follow-up OB visit. Reviewed prenatal record including previous visit note.  Encounter for supervision of normal first pregnancy in first trimester - doing well, still waiting on medical records from health department.  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 Pt stated that HR called her yesterday and now will order her a protective band around her belly. 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 Reports having had genetic testing at health department.  - Mood is good, feels happy and stable. - discussed getting breast pump through insurance and CBE +doula.  - Plans for unmedicated birth and breast feeding.  - ABO collected today - PTL signs, warning signs and fetal kick counts reviewed. - R/O in 2 weeks at 28 weeks for GTT and third trimester labs.       Orders Placed This Encounter  Procedures   US  OB Follow Up    Standing Status:   Future    Expected Date:   01/17/2024    Expiration Date:   01/02/2025    Reason for exam::   incomplete anatomy    Preferred imaging location?:   Internal   ABO AND RH    28 Week RH+Panel    Standing Status:    Future    Expected Date:   02/03/2024    Expiration Date:   01/02/2025    Release to patient:   Immediate [1]   Return in about 2 weeks (around 01/17/2024) for ROB, 28wk labs.   Future Appointments  Date Time Provider Department Center  01/11/2024  1:00 PM [facility] US  1 AOB-IMG None  01/15/2024  2:20 PM [facility]OBGY[facility] LAB [facility] None  01/15/2024  3:15 PM Jayne Harlene CROME, CNM [facility] None    For next visit:  ROB with 1 hour glucola, third trimester labs, and Tdap     Lurline Caver M Min Tunnell, CNM  07/09/255:04 PM

## 2024-01-03 NOTE — Assessment & Plan Note (Signed)
-   doing well, still waiting on medical records from health department.  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 Pt stated that HR called her yesterday and now will order her a protective band around her belly. 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 Reports having had genetic testing at health department.  - Mood is good, feels happy and stable. - discussed getting breast pump through insurance and [employer] +doula.  - Plans for unmedicated birth and breast feeding.  - ABO collected today - PTL signs, warning signs and fetal kick counts reviewed. - R/O in 2 weeks at 28 weeks for GTT and third trimester labs.

## 2024-01-04 LAB — ABO AND RH: Rh Factor: POSITIVE

## 2024-01-11 ENCOUNTER — Other Ambulatory Visit

## 2024-01-11 ENCOUNTER — Telehealth: Payer: Self-pay | Admitting: Licensed Practical Nurse

## 2024-01-11 NOTE — Telephone Encounter (Signed)
 Reached out to pt to reschedule f/u anatomy scan that was scheduled on 01/11/2024 at 1:00.  Was able to reschedule the appt to 02/01/2024 at 1:00.

## 2024-01-15 ENCOUNTER — Encounter: Payer: Self-pay | Admitting: Certified Nurse Midwife

## 2024-01-15 ENCOUNTER — Other Ambulatory Visit

## 2024-01-15 ENCOUNTER — Ambulatory Visit (INDEPENDENT_AMBULATORY_CARE_PROVIDER_SITE_OTHER): Admitting: Certified Nurse Midwife

## 2024-01-15 VITALS — BP 102/65 | HR 98 | Wt 134.6 lb

## 2024-01-15 DIAGNOSIS — Z131 Encounter for screening for diabetes mellitus: Secondary | ICD-10-CM

## 2024-01-15 DIAGNOSIS — Z113 Encounter for screening for infections with a predominantly sexual mode of transmission: Secondary | ICD-10-CM

## 2024-01-15 DIAGNOSIS — Z3401 Encounter for supervision of normal first pregnancy, first trimester: Secondary | ICD-10-CM

## 2024-01-15 DIAGNOSIS — Z3A27 27 weeks gestation of pregnancy: Secondary | ICD-10-CM

## 2024-01-15 DIAGNOSIS — Z130 Encounter for screening for diseases of the blood and blood-forming organs and certain disorders involving the immune mechanism: Secondary | ICD-10-CM

## 2024-01-15 NOTE — Patient Instructions (Signed)
 Third Trimester of Pregnancy  The third trimester of pregnancy is from week 28 through week 40. This is months 7 through 9. The third trimester is a time when your baby is growing fast. Body changes during your third trimester Your body continues to change during this time. The changes usually go away after your baby is born. Physical changes You will continue to gain weight. You may get stretch marks on your hips, belly, and breasts. Your breasts will keep growing and may hurt. A yellow fluid (colostrum) may leak from your breasts. This is the first milk you're making for your baby. Your hair may grow faster and get thicker. In some cases, you may get hair loss. Your belly button may stick out. You may have more swelling in your hands, face, or ankles. Health changes You may have heartburn. You may feel short of breath. This is caused by the uterus that is now bigger. You may have more aches in the pelvis, back, or thighs. You may have more tingling or numbness in your hands, arms, and legs. You may pee more often. You may have trouble pooping (constipation) or swollen veins in the butt that can itch or get painful (hemorrhoids). Other changes You may have more problems sleeping. You may notice the baby moving lower in your belly (dropping). You may have more fluid coming from your vagina. Your joints may feel loose, and you may have pain around your pelvic bone. Follow these instructions at home: Medicines Take medicines only as told by your health care provider. Some medicines are not safe during pregnancy. Your provider may change the medicines that you take. Do not take any medicines unless told to by your provider. Take a prenatal vitamin that has at least 600 micrograms (mcg) of folic acid. Do not use herbal medicines, illegal drugs, or medicines that are not approved by your provider. Eating and drinking While you're pregnant your body needs additional nutrition to help  support your growing baby. Talk with your provider about your nutritional needs. Activity Most women are able to exercise regularly during pregnancy. Exercise routines may need to change at the end of your pregnancy. Talk to your provider about your activities and exercise routine. Relieving pain and discomfort Rest often with your legs raised if you have leg cramps or low back pain. Take warm sitz baths to soothe pain from hemorrhoids. Use hemorrhoid cream if your provider says it's okay. Wear a good, supportive bra if your breasts hurt. Do not use hot tubs, steam rooms, or saunas. Do not douche. Do not use tampons or scented pads. Safety Talk to your provider before traveling far distances. Wear your seatbelt at all times when you're in a car. Talk to your provider if someone hits you, hurts you, or yells at you. Preparing for birth To prepare for your baby: Take childbirth and breastfeeding classes. Visit the hospital and tour the maternity area. Buy a rear-facing car seat. Learn how to install it in your car. General instructions Avoid cat litter boxes and soil used by cats. These things carry germs that can cause harm to your pregnancy and your baby. Do not drink alcohol, smoke, vape, or use products with nicotine or tobacco in them. If you need help quitting, talk with your provider. Keep all follow-up visits for your third trimester. Your provider will do more exams and tests during this trimester. Write down your questions. Take them to your prenatal visits. Your provider also will: Talk with you about  your overall health. Give you advice or refer you to specialists who can help with different needs, including: Mental health and counseling. Foods and healthy eating. Ask for help if you need help with food. Where to find more information American Pregnancy Association: americanpregnancy.org [employer]necologists: acog.org Office on [employer] Health:  TravelLesson.ca Contact a health care provider if: You have a headache that does not go away when you take medicine. You have any of these problems: You can't eat or drink. You have nausea and vomiting. You have watery poop (diarrhea) for 2 days or more. You have pain when you pee, or your pee smells bad. You have been sick for 2 days or more and aren't getting better. Contact your provider right away if: You have any of these coming from your vagina: Abnormal discharge. Bad-smelling fluid. Bleeding. Your baby is moving less than usual. You have signs of labor: You have any contractions, belly cramping, or have pain in your pelvis or lower back before 37 weeks of pregnancy (preterm labor). You have regular contractions that are less than 5 minutes apart. Your water breaks. You have symptoms of high blood pressure or preeclampsia. These include: A severe, throbbing headache that does not go away. Sudden or extreme swelling of your face, hands, legs, or feet. Vision problems: You see spots. You have blurry vision. Your eyes are sensitive to light. If you can't reach your provider, go to an urgent care or emergency room. Get help right away if: You faint, become confused, or can't think clearly. You have chest pain or trouble breathing. You have any kind of injury, such as from a fall or a car crash. These symptoms may be an emergency. Call 911 right away. Do not wait to see if the symptoms will go away. Do not drive yourself to the hospital. This information is not intended to replace advice given to you by your health care provider. Make sure you discuss any questions you have with your health care provider. Document Revised: 03/16/2023 Document Reviewed: 10/14/2022 Elsevier Patient Education  2024 Elsevier Inc. Early Labor (Pre-Term Labor): What to Know Pregnancy normally lasts 39-41 weeks. Pre-term labor is when labor starts before you have been pregnant for 37 weeks. Babies  who are born too early may be at an increased risk for long-term problems like cerebral palsy, developmental delays, and vision and hearing problems. Premature babies may also have problems soon after birth, such as problems with their blood sugar, body temperature, heart, and breathing. These babies often have trouble with feeding. These problems may be very serious in babies who are born before 34 weeks of pregnancy. What are the causes? The cause of pre-term labor is not known. What increases the risk? You're more likely to have pre-term labor if: You have medical problems, now or in the past. You have problems now or in your past pregnancies. You have risk factors related to lifestyle, environment, and age. Medical history You have problems affecting your uterus, including a short cervix. You have a sexually transmitted infection (STI) or other infections of the urinary tract and the vagina. You have: Blood clotting problems. High blood pressure. High blood sugar. You have a low body weight or too much body weight. Present and past pregnancies You have had pre-term labor before. You're pregnant with more than one baby. The placenta covers your cervix,. This is called placenta previa. Your unborn baby has a congenital condition. This means your baby will be born with  the condition. You have bleeding from your vagina while you're pregnant. You became pregnant through in vitro fertilization (IVF). Lifestyle and environmental factors You use drugs, tobacco products or drink alcohol. You have stress and no social support. You experience domestic violence. You're exposed to certain chemicals at home or work. Other factors You're younger than age 40 or older than age 66. What are the signs or symptoms?  Cramps like those that can happen during a menstrual period. The cramps may happen with diarrhea, which is watery poop. Pain your belly or lower back. Regular contractions. It may feel  like your belly is getting tighter. Pressure in your pelvis. Increased watery or bloody discharge from your vagina. Your water breaking. How is this diagnosed? This condition is diagnosed based on: Your medical history and a physical exam. A pelvic exam. An ultrasound. Monitoring for contractions. Other tests, including: A swab of the cervix to check for a protein substance called fetal fibronectin. This protein is usually present in the area between the uterus and the amniotic sac early in pregnancy and then again towards the end. If it's found in the middle of pregnancy, it can sometimes be a sign of pre-term labor. Urine tests. How is this treated? Treatment depends on how far along your pregnancy is, the health of your baby, and your health. Treatment may include: Taking medicines to: Stop contractions. Help the baby's lungs mature if the risk of early delivery is high. Medicines to help prevent your baby from having cerebral palsy or other problems. Bed rest. If the labor happens before 34 weeks of pregnancy, you may need to stay in the hospital. Delivery of the baby. Follow these instructions at home: Do not smoke, vape, or use nicotine or tobacco. Do not drink alcohol. Take your medicines only as told. Rest as told by your provider. Ask what things are safe for you to do at home. Ask when you can go back to work or school. Keep all follow-up visits. Your provider will need to closely follow your health and the health of your baby. How is this prevented? To increase your chance of having a full-term pregnancy: Do not use drugs or take medicines that have not been prescribed to you during your pregnancy. Talk with your provider before taking any herbal supplements, even if you have been taking them regularly. Gain a healthy amount of weight during your pregnancy. Watch for infection. If you think that you might have an infection, get it checked right away. Symptoms of infection  may include: Fever. Discharge from your vagina that smells bad or is not normal. Pain or burning when you pee. Having to pee small amounts or very often. Blood in your pee. Where to find more information To learn more, go to these websites: U.S. Department of Health and Cytogeneticist on Women's Health: http://hoffman.com/ [employer]ians [employer]necologists: www.acog.org Centers for Disease Control and Prevention at DiningCalendar.de. Then: Click "Search" and type "preterm labor." Find the link you need. Contact a health care provider if: You think you're going into pre-term labor. You have signs or symptoms of pre-term labor. You have symptoms of infection. Get help right away if: You're having regular, painful contractions every 5 minutes or less. Your water breaks. This information is not intended to replace advice given to you by your health care provider. Make sure you discuss any questions you have with your health care provider. Document Revised: 12/22/2022 Document Reviewed: 12/22/2022 Elsevier Patient Education  2024 Elsevier Inc.

## 2024-01-15 NOTE — Progress Notes (Signed)
    Return Prenatal Note   Subjective   23 y.o. G1P0 at [redacted]w[redacted]d presents for this follow-up prenatal visit.  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 Patient reports: Movement: Present Contractions: Not present  Objective   Flow sheet Vitals: Pulse Rate: 98 BP: 102/65 Fundal Height: 28 cm Fetal Heart Rate (bpm): 140 Total weight gain: 20 lb 9.6 oz (9.344 kg)  General Appearance  No acute distress, well appearing, and well nourished Pulmonary   Normal work of breathing Neurologic   Alert and oriented to person, place, and time Psychiatric   Mood and affect within normal limits  Assessment/Plan   Plan  23 y.o. G1P0 at [redacted]w[redacted]d presents for follow-up OB visit. Reviewed prenatal record including previous visit note.  Encounter for supervision of normal first pregnancy in first trimester Reviewed kick counts and preterm labor warning signs. Instructed to call office or come to hospital with persistent headache, vision changes, regular contractions, leaking of fluid, decreased fetal movement or vaginal bleeding. Work note provided. Birth preferences planning reviewed. [email or web address] comparison sent via MyChart.      No orders of the defined types were placed in this encounter.  Return in 2 weeks (on 01/29/2024) for ROB.   Future Appointments  Date Time Provider Department Center  01/30/2024  9:15 AM Slaughterbeck, Damien, CNM [facility] None  02/01/2024  1:00 PM A[facility]-A[facility] US  1 AOB-IMG None    For next visit:  continue with routine prenatal care     Harlene LITTIE Cisco, CNM  21/21/253:10 PM

## 2024-01-15 NOTE — Assessment & Plan Note (Signed)
 Reviewed kick counts and preterm labor warning signs. Instructed to call office or come to hospital with persistent headache, vision changes, regular contractions, leaking of fluid, decreased fetal movement or vaginal bleeding. Work note provided. Birth preferences planning reviewed. [email or web address] comparison sent via MyChart.

## 2024-01-16 ENCOUNTER — Ambulatory Visit: Payer: Self-pay | Admitting: Licensed Practical Nurse

## 2024-01-16 LAB — 28 WEEK RH+PANEL
Basophils Absolute: 0.1 x10E3/uL (ref 0.0–0.2)
Basos: 1 %
EOS (ABSOLUTE): 0.3 x10E3/uL (ref 0.0–0.4)
Eos: 2 %
Gestational Diabetes Screen: 139 mg/dL (ref 70–139)
HIV Screen 4th Generation wRfx: NONREACTIVE
Hematocrit: 33 % — ABNORMAL LOW (ref 34.0–46.6)
Hemoglobin: 11.1 g/dL (ref 11.1–15.9)
Immature Grans (Abs): 0.3 x10E3/uL — ABNORMAL HIGH (ref 0.0–0.1)
Immature Granulocytes: 2 %
Lymphocytes Absolute: 2.3 x10E3/uL (ref 0.7–3.1)
Lymphs: 19 %
MCH: 31.5 pg (ref 26.6–33.0)
MCHC: 33.6 g/dL (ref 31.5–35.7)
MCV: 94 fL (ref 79–97)
Monocytes Absolute: 0.7 x10E3/uL (ref 0.1–0.9)
Monocytes: 6 %
Neutrophils Absolute: 8.3 x10E3/uL — ABNORMAL HIGH (ref 1.4–7.0)
Neutrophils: 70 %
Platelets: 287 x10E3/uL (ref 150–450)
RBC: 3.52 x10E6/uL — ABNORMAL LOW (ref 3.77–5.28)
RDW: 12.3 % (ref 11.7–15.4)
RPR Ser Ql: NONREACTIVE
WBC: 11.8 x10E3/uL — ABNORMAL HIGH (ref 3.4–10.8)

## 2024-01-23 LAB — OB RESULTS CONSOLE HEPATITIS B SURFACE ANTIGEN: Hepatitis B Surface Ag: NEGATIVE

## 2024-01-23 LAB — OB RESULTS CONSOLE RUBELLA ANTIBODY, IGM: Rubella: NON-IMMUNE/NOT IMMUNE

## 2024-01-23 LAB — OB RESULTS CONSOLE VARICELLA ZOSTER ANTIBODY, IGG: Varicella: NON-IMMUNE/NOT IMMUNE

## 2024-01-23 LAB — OB RESULTS CONSOLE GC/CHLAMYDIA
Chlamydia: NEGATIVE
Neisseria Gonorrhea: NEGATIVE

## 2024-01-23 LAB — HEPATITIS C ANTIBODY: HCV Ab: NEGATIVE

## 2024-01-26 ENCOUNTER — Encounter: Payer: Self-pay | Admitting: Certified Nurse Midwife

## 2024-01-29 NOTE — Progress Notes (Unsigned)
    Return Prenatal Note   Subjective   23 y.o. G1P0 at [redacted]w[redacted]d presents for this follow-up prenatal visit.  Patient has been having some umbilical pain that comes and goes.  Patient reports: Movement: Present Contractions: Not present  Objective   Flow sheet Vitals: Pulse Rate: 91 BP: 116/70 Fundal Height: 30 cm Fetal Heart Rate (bpm): 140 Total weight gain: 22 lb 14.4 oz (10.4 kg)  General Appearance  No acute distress, well appearing, and well nourished Pulmonary   Normal work of breathing Neurologic   Alert and oriented to person, place, and time Psychiatric   Mood and affect within normal limits  Assessment/Plan   Plan  23 y.o. G1P0 at [redacted]w[redacted]d presents for follow-up OB visit. Reviewed prenatal record including previous visit note.  Encounter for supervision of normal first pregnancy in first trimester Reviewed normal umbilical pain in pregnancy. No evidence of hernia.  F/U US  for incomplete anatomy scan Reviewed red flag warning signs anticipatory guidance for upcoming prenatal care.        No orders of the defined types were placed in this encounter.  No follow-ups on file.   Future Appointments  Date Time Provider Department Center  02/01/2024  1:00 PM [facility] US  1 AOB-IMG None    For next visit:  continue with routine prenatal care     Damien Parsley, CNM [facility] 08/05/257:48 PM

## 2024-01-30 ENCOUNTER — Encounter: Payer: Self-pay | Admitting: Certified Nurse Midwife

## 2024-01-30 ENCOUNTER — Ambulatory Visit (INDEPENDENT_AMBULATORY_CARE_PROVIDER_SITE_OTHER): Admitting: Certified Nurse Midwife

## 2024-01-30 VITALS — BP 116/70 | HR 91 | Wt 136.9 lb

## 2024-01-30 DIAGNOSIS — Z3401 Encounter for supervision of normal first pregnancy, first trimester: Secondary | ICD-10-CM | POA: Diagnosis not present

## 2024-01-30 NOTE — Assessment & Plan Note (Addendum)
 Reviewed normal umbilical pain in pregnancy. No evidence of hernia.  F/U US  for incomplete anatomy scan Reviewed red flag warning signs anticipatory guidance for upcoming prenatal care.

## 2024-02-01 ENCOUNTER — Ambulatory Visit

## 2024-02-01 DIAGNOSIS — Z3402 Encounter for supervision of normal first pregnancy, second trimester: Secondary | ICD-10-CM | POA: Diagnosis not present

## 2024-02-01 DIAGNOSIS — Z3401 Encounter for supervision of normal first pregnancy, first trimester: Secondary | ICD-10-CM

## 2024-02-01 DIAGNOSIS — Z363 Encounter for antenatal screening for malformations: Secondary | ICD-10-CM

## 2024-02-01 DIAGNOSIS — Z3A26 26 weeks gestation of pregnancy: Secondary | ICD-10-CM

## 2024-02-02 ENCOUNTER — Other Ambulatory Visit: Payer: Self-pay

## 2024-02-02 ENCOUNTER — Observation Stay
Admission: EM | Admit: 2024-02-02 | Discharge: 2024-02-03 | Disposition: A | Discharge status: Home / Self Care | Attending: Certified Nurse Midwife | Admitting: Certified Nurse Midwife

## 2024-02-02 DIAGNOSIS — O23593 Infection of other part of genital tract in pregnancy, third trimester: Secondary | ICD-10-CM | POA: Diagnosis not present

## 2024-02-02 DIAGNOSIS — R252 Cramp and spasm: Secondary | ICD-10-CM | POA: Diagnosis not present

## 2024-02-02 DIAGNOSIS — O26893 Other specified pregnancy related conditions, third trimester: Secondary | ICD-10-CM | POA: Diagnosis present

## 2024-02-02 DIAGNOSIS — Z3A30 30 weeks gestation of pregnancy: Secondary | ICD-10-CM | POA: Diagnosis not present

## 2024-02-02 LAB — URINALYSIS, ROUTINE W REFLEX MICROSCOPIC
Bacteria, UA: NONE SEEN
Bilirubin Urine: NEGATIVE
Glucose, UA: NEGATIVE mg/dL
Hgb urine dipstick: NEGATIVE
Ketones, ur: NEGATIVE mg/dL
Nitrite: NEGATIVE
Protein, ur: NEGATIVE mg/dL
Specific Gravity, Urine: 1.012 (ref 1.005–1.030)
pH: 6 (ref 5.0–8.0)

## 2024-02-02 MED ORDER — LACTATED RINGERS IV SOLN: INTRAVENOUS | Status: DC

## 2024-02-02 MED ORDER — LACTATED RINGERS IV SOLN
500.0000 mL | INTRAVENOUS | Status: DC | PRN
  Administered 2024-02-02: 1000 mL via INTRAVENOUS

## 2024-02-03 DIAGNOSIS — B9689 Other specified bacterial agents as the cause of diseases classified elsewhere: Secondary | ICD-10-CM

## 2024-02-03 DIAGNOSIS — O26893 Other specified pregnancy related conditions, third trimester: Secondary | ICD-10-CM | POA: Diagnosis not present

## 2024-02-03 DIAGNOSIS — Z3A30 30 weeks gestation of pregnancy: Secondary | ICD-10-CM

## 2024-02-03 DIAGNOSIS — N760 Acute vaginitis: Secondary | ICD-10-CM

## 2024-02-03 DIAGNOSIS — O99891 Other specified diseases and conditions complicating pregnancy: Secondary | ICD-10-CM

## 2024-02-03 LAB — WET PREP, GENITAL
Sperm: NONE SEEN
Trich, Wet Prep: NONE SEEN
WBC, Wet Prep HPF POC: >=10 — AB (ref <10)
Yeast Wet Prep HPF POC: NONE SEEN

## 2024-02-03 LAB — FETAL FIBRONECTIN: Fetal Fibronectin: NEGATIVE

## 2024-02-03 MED ORDER — METRONIDAZOLE 500 MG PO TABS
500.0000 mg | ORAL_TABLET | Freq: Two times a day (BID) | ORAL | Status: DC
  Administered 2024-02-03: 500 mg via ORAL
  Filled 2024-02-03: qty 1

## 2024-02-03 MED ORDER — TERBUTALINE SULFATE 1 MG/ML IJ SOLN
0.2500 mg | Freq: Once | INTRAMUSCULAR | Status: AC
  Administered 2024-02-03: 0.25 mg via SUBCUTANEOUS
  Filled 2024-02-03: qty 1

## 2024-02-03 MED ORDER — METRONIDAZOLE 500 MG PO TABS: 500.0000 mg | ORAL_TABLET | Freq: Two times a day (BID) | ORAL | 0 refills | Status: AC

## 2024-02-03 NOTE — Progress Notes (Signed)
   L&D OB Triage Note  SUBJECTIVE Kristina English is a 23 y.o. G1P0 female at [redacted]w[redacted]d, EDD Estimated Date of Delivery: 04/09/24 who presented to triage with complaints of cramping.  Sje denies loss of fluid, vaginal bleeding and is  feeling good fetal movement.   OB History  Gravida Para Term Preterm AB Living  1 0 0 0 0 0  SAB IAB Ectopic Multiple Live Births  0 0 0 0 0    # Outcome Date GA Lbr Len/2nd Weight Sex Type Anes PTL Lv  1 Current             Medications Prior to Admission  Medication Sig Dispense Refill Last Dose/Taking   Prenatal Vit-Fe Fumarate-FA (MULTIVITAMIN-PRENATAL) 27-0.8 MG TABS tablet Take 1 tablet by mouth daily at 12 noon.   02/02/2024   cephALEXin  (KEFLEX ) 500 MG capsule Take 1 capsule (500 mg total) by mouth 3 (three) times daily. (Patient not taking: Reported on 02/02/2024) 21 capsule 0 Not Taking   dicyclomine  (BENTYL ) 20 MG tablet Take 20 mg by mouth 4 (four) times daily.      ondansetron  (ZOFRAN -ODT) 4 MG disintegrating tablet Take 1 tablet (4 mg total) by mouth every 8 (eight) hours as needed for nausea or vomiting. 20 tablet 0      OBJECTIVE  Nursing Evaluation:   BP 104/61 (BP Location: Right Arm)   Pulse 87   Temp 98.2 F (36.8 C) (Oral)   Resp 19   Wt 61.3 kg   LMP 07/04/2023   SpO2 99%   BMI 28.26 kg/m    Findings:  irregular mild preterm contractions      NST was performed and has been reviewed by me.  NST INTERPRETATION: Category I  Mode: External Baseline Rate (A): 135 bpm Variability: Moderate Accelerations: 10 x 10 Decelerations: None Nonstress Test Interpretation: Reactive Overall Impression: Reassuring for gestational age Contraction Frequency (min): irritability  ASSESSMENT Impression:  1.  Pregnancy:  G1P0 at [redacted]w[redacted]d , EDD Estimated Date of Delivery: 04/09/24 2.  Reassuring fetal and maternal status 3.  Bacterial Vaginosis   PLAN 1. Current condition and above findings reviewed.  Reassuring fetal and maternal  condition. 2. Discharge home with standard labor precautions given to return to L&D or call the office for problems. Flagyl  500 mg BID x 7 days 3. Continue routine prenatal care.    I was present and evaluated this patient in person.   Zelda Hummer, CNM

## 2024-02-04 LAB — URINE CULTURE: Culture: NO GROWTH

## 2024-02-13 ENCOUNTER — Ambulatory Visit (INDEPENDENT_AMBULATORY_CARE_PROVIDER_SITE_OTHER): Admitting: Licensed Practical Nurse

## 2024-02-13 ENCOUNTER — Encounter: Payer: Self-pay | Admitting: Licensed Practical Nurse

## 2024-02-13 VITALS — BP 112/75 | HR 108 | Wt 137.7 lb

## 2024-02-13 DIAGNOSIS — Z3A32 32 weeks gestation of pregnancy: Secondary | ICD-10-CM | POA: Diagnosis not present

## 2024-02-13 DIAGNOSIS — Z3401 Encounter for supervision of normal first pregnancy, first trimester: Secondary | ICD-10-CM

## 2024-02-13 DIAGNOSIS — Z3403 Encounter for supervision of normal first pregnancy, third trimester: Secondary | ICD-10-CM | POA: Diagnosis not present

## 2024-02-13 DIAGNOSIS — R0602 Shortness of breath: Secondary | ICD-10-CM | POA: Insufficient documentation

## 2024-02-13 NOTE — Assessment & Plan Note (Signed)
-  Lungs CTAB, Heart HR 100 otherwise normal rhythm, no skips, gallops or murmurs. -Attempted to get orthostatic BP unable to get standing as BP cuff not able to register while standing. Laying 110/74 HR 108, Sitting 113/72, HR 98, Standing HR 116  -Discussed physiologic changes in pregnancy however is it is best to be evaluated for any potential cardiac concerns, referral to cardiology placed.

## 2024-02-13 NOTE — Assessment & Plan Note (Signed)
-  watching videos on line about labor and birth  -Was seen in L and D recently for ctx's, has occasional contractions now nothing consistent  -TWG 23lbs, WNL -comfort measures reviewed for sciatica  -warning signs reviewed

## 2024-02-13 NOTE — Progress Notes (Signed)
    Return Prenatal Note   Subjective   23 y.o. G1P0 at [redacted]w[redacted]d presents for this follow-up prenatal visit.  Patient  Patient reports: Doing ok, has pain in her left buttock.   Pt sitting uncomfortably in chair, states she gets short of breath so it gets uncomfortable at times, this started a few days ago. She is able still go for walks without any difficulty. Does not get SOB at rest. Mainly after being active and when she first sits down, she becomes short of breath. Once pt was sitting on the exam table she appeared more comfortable and breathing without difficulty.   Movement: Present Contractions: Irritability  Objective   Flow sheet Vitals: Pulse Rate: (!) 108 BP: 112/75 Fundal Height: 32 cm Fetal Heart Rate (bpm): 135 Total weight gain: 23 lb 11.2 oz (10.8 kg)  General Appearance  No acute distress, well appearing, and well nourished Pulmonary   Normal work of breathing Neurologic   Alert and oriented to person, place, and time Psychiatric   Mood and affect within normal limits  Assessment/Plan   Plan  23 y.o. G1P0 at [redacted]w[redacted]d presents for follow-up OB visit. Reviewed prenatal record including previous visit note.  Shortness of breath -Lungs CTAB, Heart HR 100 otherwise normal rhythm, no skips, gallops or murmurs. -Attempted to get orthostatic BP unable to get standing as BP cuff not able to register while standing. Laying 110/74 HR 108, Sitting 113/72, HR 98, Standing HR 116  -Discussed physiologic changes in pregnancy however is it is best to be evaluated for any potential cardiac concerns, referral to cardiology placed.    Encounter for supervision of normal first pregnancy in first trimester -watching videos on line about labor and birth  -Was seen in L and D recently for ctx's, has occasional contractions now nothing consistent  -TWG 23lbs, WNL -comfort measures reviewed for sciatica  -warning signs reviewed      Orders Placed This Encounter  Procedures    Ambulatory referral to Cardiology    Referral Priority:   Routine    Referral Type:   Consultation    Referral Reason:   Specialty Services Required    Number of Visits Requested:   1   Return in about 2 weeks (around 02/27/2024) for ROB.   Future Appointments  Date Time Provider Department Center  02/27/2024  3:35 PM Jayne Harlene CROME, CNM [facility] None    For next visit:  continue with routine prenatal care     Zadin Lange [facility], CNM  08/19/255:30 PM

## 2024-02-15 ENCOUNTER — Other Ambulatory Visit: Payer: Self-pay | Admitting: Licensed Practical Nurse

## 2024-02-15 DIAGNOSIS — Z3403 Encounter for supervision of normal first pregnancy, third trimester: Secondary | ICD-10-CM

## 2024-02-15 DIAGNOSIS — Z3A32 32 weeks gestation of pregnancy: Secondary | ICD-10-CM

## 2024-02-15 DIAGNOSIS — Z362 Encounter for other antenatal screening follow-up: Secondary | ICD-10-CM

## 2024-02-15 NOTE — Progress Notes (Signed)
 MFM referral placed for US   Jinnie Cookey, CNM  [facility] 02/15/24  9:07 PM

## 2024-02-16 NOTE — Addendum Note (Signed)
 Addended by: TAFT CAMELIA MATSU on: 02/16/2024 03:10 PM   Modules accepted: Orders

## 2024-02-16 NOTE — Progress Notes (Signed)
 Order changed to US  MFM OB Detail +14 per MFM requirements.

## 2024-02-27 ENCOUNTER — Ambulatory Visit (INDEPENDENT_AMBULATORY_CARE_PROVIDER_SITE_OTHER): Admitting: Certified Nurse Midwife

## 2024-02-27 VITALS — BP 112/75 | HR 101 | Wt 133.4 lb

## 2024-02-27 DIAGNOSIS — Z3A34 34 weeks gestation of pregnancy: Secondary | ICD-10-CM

## 2024-02-27 DIAGNOSIS — Z3403 Encounter for supervision of normal first pregnancy, third trimester: Secondary | ICD-10-CM

## 2024-02-27 NOTE — Progress Notes (Signed)
    Return Prenatal Note   Subjective   23 y.o. G1P0 at [redacted]w[redacted]d presents for this follow-up prenatal visit.  Patient feeling well, active baby, accompanied by sister today. Patient reports: Movement: Present Contractions: Irritability  Objective   Flow sheet Vitals: Pulse Rate: (!) 101 BP: 112/75 Fundal Height: 34 cm Fetal Heart Rate (bpm): 140 Presentation: Vertex (Leopold's) Total weight gain: 19 lb 6.4 oz (8.8 kg)  General Appearance  No acute distress, well appearing, and well nourished Pulmonary   Normal work of breathing Neurologic   Alert and oriented to person, place, and time Psychiatric   Mood and affect within normal limits   Assessment/Plan   Plan  23 y.o. G1P0 at [redacted]w[redacted]d presents for follow-up OB visit. Reviewed prenatal record including previous visit note.  Encounter for supervision of normal first pregnancy Reviewed labor warning signs and expectations for birth. Instructed to call office or come to hospital with persistent headache, vision changes, regular contractions, leaking of fluid, decreased fetal movement or vaginal bleeding. Anticipatory guidance for 36w swabs reviewed.      No orders of the defined types were placed in this encounter.  Return in 2 weeks (on 03/12/2024) for ROB.   Future Appointments  Date Time Provider Department Center  03/05/2024  7:00 AM [facility] PROVIDER 1 [facility]  03/05/2024  7:30 AM [facility] US5 [facility]  03/12/2024  8:35 AM Slaughterbeck, Damien, CNM [facility] None    For next visit:  ROB with GBS screening      Harlene LITTIE Cisco, CNM  09/02/254:44 PM

## 2024-02-27 NOTE — Assessment & Plan Note (Addendum)
 Reviewed labor warning signs and expectations for birth. Instructed to call office or come to hospital with persistent headache, vision changes, regular contractions, leaking of fluid, decreased fetal movement or vaginal bleeding. Anticipatory guidance for 36w swabs reviewed.

## 2024-02-27 NOTE — Patient Instructions (Signed)
 Third Trimester of Pregnancy  The third trimester of pregnancy is from week 28 through week 40. This is months 7 through 9. The third trimester is a time when your baby is growing fast. Body changes during your third trimester Your body continues to change during this time. The changes usually go away after your baby is born. Physical changes You will continue to gain weight. You may get stretch marks on your hips, belly, and breasts. Your breasts will keep growing and may hurt. A yellow fluid (colostrum) may leak from your breasts. This is the first milk you're making for your baby. Your hair may grow faster and get thicker. In some cases, you may get hair loss. Your belly button may stick out. You may have more swelling in your hands, face, or ankles. Health changes You may have heartburn. You may feel short of breath. This is caused by the uterus that is now bigger. You may have more aches in the pelvis, back, or thighs. You may have more tingling or numbness in your hands, arms, and legs. You may pee more often. You may have trouble pooping (constipation) or swollen veins in the butt that can itch or get painful (hemorrhoids). Other changes You may have more problems sleeping. You may notice the baby moving lower in your belly (dropping). You may have more fluid coming from your vagina. Your joints may feel loose, and you may have pain around your pelvic bone. Follow these instructions at home: Medicines Take medicines only as told by your health care provider. Some medicines are not safe during pregnancy. Your provider may change the medicines that you take. Do not take any medicines unless told to by your provider. Take a prenatal vitamin that has at least 600 micrograms (mcg) of folic acid. Do not use herbal medicines, illegal drugs, or medicines that are not approved by your provider. Eating and drinking While you're pregnant your body needs additional nutrition to help  support your growing baby. Talk with your provider about your nutritional needs. Activity Most women are able to exercise regularly during pregnancy. Exercise routines may need to change at the end of your pregnancy. Talk to your provider about your activities and exercise routine. Relieving pain and discomfort Rest often with your legs raised if you have leg cramps or low back pain. Take warm sitz baths to soothe pain from hemorrhoids. Use hemorrhoid cream if your provider says it's okay. Wear a good, supportive bra if your breasts hurt. Do not use hot tubs, steam rooms, or saunas. Do not douche. Do not use tampons or scented pads. Safety Talk to your provider before traveling far distances. Wear your seatbelt at all times when you're in a car. Talk to your provider if someone hits you, hurts you, or yells at you. Preparing for birth To prepare for your baby: Take childbirth and breastfeeding classes. Visit the hospital and tour the maternity area. Buy a rear-facing car seat. Learn how to install it in your car. General instructions Avoid cat litter boxes and soil used by cats. These things carry germs that can cause harm to your pregnancy and your baby. Do not drink alcohol, smoke, vape, or use products with nicotine or tobacco in them. If you need help quitting, talk with your provider. Keep all follow-up visits for your third trimester. Your provider will do more exams and tests during this trimester. Write down your questions. Take them to your prenatal visits. Your provider also will: Talk with you about  your overall health. Give you advice or refer you to specialists who can help with different needs, including: Mental health and counseling. Foods and healthy eating. Ask for help if you need help with food. Where to find more information American Pregnancy Association: americanpregnancy.org [employer]necologists: acog.org Office on [employer] Health:  [employer] Contact a health care provider if: You have a headache that does not go away when you take medicine. You have any of these problems: You can't eat or drink. You have nausea and vomiting. You have watery poop (diarrhea) for 2 days or more. You have pain when you pee, or your pee smells bad. You have been sick for 2 days or more and aren't getting better. Contact your provider right away if: You have any of these coming from your vagina: Abnormal discharge. Bad-smelling fluid. Bleeding. Your baby is moving less than usual. You have signs of labor: You have any contractions, belly cramping, or have pain in your pelvis or lower back before 37 weeks of pregnancy (preterm labor). You have regular contractions that are less than 5 minutes apart. Your water breaks. You have symptoms of high blood pressure or preeclampsia. These include: A severe, throbbing headache that does not go away. Sudden or extreme swelling of your face, hands, legs, or feet. Vision problems: You see spots. You have blurry vision. Your eyes are sensitive to light. If you can't reach your provider, go to an urgent care or emergency room. Get help right away if: You faint, become confused, or can't think clearly. You have chest pain or trouble breathing. You have any kind of injury, such as from a fall or a car crash. These symptoms may be an emergency. Call 911 right away. Do not wait to see if the symptoms will go away. Do not drive yourself to the hospital. This information is not intended to replace advice given to you by your health care provider. Make sure you discuss any questions you have with your health care provider. Document Revised: 03/16/2023 Document Reviewed: 10/14/2022 Elsevier Patient Education  2024 Elsevier Inc. Group B Streptococcus Infection During Pregnancy Group B Streptococcus (GBS) is a type of bacteria that is often found in healthy people. It is commonly found in the  rectum, vagina, and intestines. In people who are healthy and not pregnant, the bacteria rarely cause serious illness or complications. However, women who test positive for GBS during pregnancy can pass the bacteria to the baby during childbirth. This can cause serious infection in the baby after birth. Women with GBS may also have infections during their pregnancy or soon after childbirth. The infections include urinary tract infections (UTIs) or infections of the uterus. GBS also increases a woman's risk of complications during pregnancy, such as early labor or delivery, miscarriage, or stillbirth. Routine testing for GBS is recommended for all pregnant women. What are the causes? This condition is caused by bacteria called Streptococcus agalactiae. What increases the risk? You may have a higher risk for GBS infection during pregnancy if you had one during a past pregnancy. What are the signs or symptoms? In most cases, GBS infection does not cause symptoms in pregnant women. If symptoms exist, they may include: Labor that starts before the 37th week of pregnancy. A UTI or bladder infection. This may cause a fever, frequent urination, or pain and burning during urination. Fever during labor. There can also be a rapid heartbeat in the mother or baby. Rare but serious symptoms of a GBS  infection in women include: Blood infection (septicemia). This may cause fever, chills, or confusion. Lung infection (pneumonia). This may cause fever, chills, cough, rapid breathing, chest pain, or difficulty breathing. Bone, joint, skin, or soft tissue infection. How is this diagnosed? You may be screened for GBS between week 35 and week 37 of pregnancy. If you have symptoms of preterm labor, you may be screened earlier. This condition is diagnosed based on lab test results from: A swab of fluid from the vagina and rectum. A urine sample. How is this treated? This condition is treated with antibiotic medicine.  Antibiotic medicine may be given: To you when you go into labor, or as soon as your water breaks. The medicines will continue until after you give birth. If you are having a cesarean delivery, you do not need antibiotics unless your water has broken. To your baby, if he or she requires treatment. Your health care provider will check your baby to decide if he or she needs antibiotics to prevent a serious infection. Follow these instructions at home: Take over-the-counter and prescription medicines only as told by your health care provider. Take your antibiotic medicine as told by your health care provider. Do not stop taking the antibiotic even if you start to feel better. Keep all pre-birth (prenatal) visits and follow-up visits as told by your health care provider. This is important. Contact a health care provider if: You have pain or burning when you urinate. You have to urinate more often than usual. You have a fever or chills. You develop a bad-smelling vaginal discharge. Get help right away if: Your water breaks. You go into labor. You have severe pain in your abdomen. You have difficulty breathing. You have chest pain. These symptoms may represent a serious problem that is an emergency. Do not wait to see if the symptoms will go away. Get medical help right away. Call your local emergency services (911 in the [place] Do not drive yourself to the hospital. Summary GBS is a type of bacteria that is common in healthy people. During pregnancy, colonization with GBS can cause serious complications for you or your baby. Your health care provider will screen you between 35 and 37 weeks of pregnancy to determine if you are colonized with GBS. If you are colonized with GBS during pregnancy, your health care provider will recommend antibiotics through an IV during labor. After delivery, your baby will be evaluated for complications related to potential GBS infection and may require antibiotics to  prevent a serious infection. This information is not intended to replace advice given to you by your health care provider. Make sure you discuss any questions you have with your health care provider. Document Revised: 05/30/2022 Document Reviewed: 05/30/2022 Elsevier Patient Education  2024 [employer].

## 2024-03-05 ENCOUNTER — Ambulatory Visit

## 2024-03-05 ENCOUNTER — Other Ambulatory Visit

## 2024-03-05 DIAGNOSIS — R0602 Shortness of breath: Secondary | ICD-10-CM

## 2024-03-05 DIAGNOSIS — Z3403 Encounter for supervision of normal first pregnancy, third trimester: Secondary | ICD-10-CM

## 2024-03-07 ENCOUNTER — Ambulatory Visit: Attending: Obstetrics and Gynecology | Admitting: Obstetrics and Gynecology

## 2024-03-07 ENCOUNTER — Other Ambulatory Visit

## 2024-03-07 ENCOUNTER — Ambulatory Visit (HOSPITAL_BASED_OUTPATIENT_CLINIC_OR_DEPARTMENT_OTHER)

## 2024-03-07 ENCOUNTER — Ambulatory Visit

## 2024-03-07 VITALS — BP 124/67 | HR 113

## 2024-03-07 DIAGNOSIS — Z3A35 35 weeks gestation of pregnancy: Secondary | ICD-10-CM | POA: Insufficient documentation

## 2024-03-07 DIAGNOSIS — Z363 Encounter for antenatal screening for malformations: Secondary | ICD-10-CM | POA: Insufficient documentation

## 2024-03-07 DIAGNOSIS — O0933 Supervision of pregnancy with insufficient antenatal care, third trimester: Secondary | ICD-10-CM | POA: Diagnosis present

## 2024-03-07 DIAGNOSIS — Z362 Encounter for other antenatal screening follow-up: Secondary | ICD-10-CM | POA: Diagnosis not present

## 2024-03-07 DIAGNOSIS — O0930 Supervision of pregnancy with insufficient antenatal care, unspecified trimester: Secondary | ICD-10-CM

## 2024-03-07 NOTE — Progress Notes (Signed)
 Maternal-Fetal Medicine Consultation  Name: Kristina English  MRN: [id number]  GA: G1P0 [redacted]w[redacted]d   Late to prenatal care.  Patient is here for fetal anatomy scan.  Fetal anatomical survey could not be completed at your office ultrasound.  She has not had screening for fetal aneuploidies.  She does not have gestational diabetes (1-hour GCT 139 mg/dL).  Patient reports no chronic medical conditions including hypertension.  Blood pressure today at our office is 124/67 mmHg.  Ultrasound Fetal growth is appropriate for gestational age.  Amniotic fluid normal good fetal activity seen.  Fetal anatomical survey appears normal but limited by advanced gestational age.  I explained that third trimester ultrasound evaluation of fetal anatomy is suboptimal and has limitations in detecting fetal anomalies.  I discussed the cutoff value for diagnosing gestational diabetes.  It ranges from 135 mg/dL to 859 mg/dL.  Her provider uses a threshold of 140 mg/dL.  I reassured the patient that there is no evidence of macrosomia or polyhydramnios.  Recommendations - No follow-up appointments were made.     Consultation including face-to-face (more than 50%) counseling 15 minutes.

## 2024-03-12 ENCOUNTER — Encounter: Payer: Self-pay | Admitting: Certified Nurse Midwife

## 2024-03-12 ENCOUNTER — Encounter: Admitting: Certified Nurse Midwife

## 2024-03-12 DIAGNOSIS — Z3A36 36 weeks gestation of pregnancy: Secondary | ICD-10-CM

## 2024-03-12 DIAGNOSIS — Z113 Encounter for screening for infections with a predominantly sexual mode of transmission: Secondary | ICD-10-CM

## 2024-03-12 DIAGNOSIS — Z3403 Encounter for supervision of normal first pregnancy, third trimester: Secondary | ICD-10-CM

## 2024-03-12 DIAGNOSIS — Z3685 Encounter for antenatal screening for Streptococcus B: Secondary | ICD-10-CM

## 2024-03-12 NOTE — Telephone Encounter (Signed)
 Please reach out and reschedule.

## 2024-03-15 ENCOUNTER — Ambulatory Visit (INDEPENDENT_AMBULATORY_CARE_PROVIDER_SITE_OTHER): Admitting: Certified Nurse Midwife

## 2024-03-15 ENCOUNTER — Encounter: Payer: Self-pay | Admitting: Certified Nurse Midwife

## 2024-03-15 ENCOUNTER — Other Ambulatory Visit (HOSPITAL_COMMUNITY)
Admission: RE | Admit: 2024-03-15 | Discharge: 2024-03-15 | Disposition: A | Discharge status: Home / Self Care | Source: Ambulatory Visit | Attending: Certified Nurse Midwife | Admitting: Certified Nurse Midwife

## 2024-03-15 VITALS — BP 118/77 | HR 112 | Wt 145.6 lb

## 2024-03-15 DIAGNOSIS — Z113 Encounter for screening for infections with a predominantly sexual mode of transmission: Secondary | ICD-10-CM

## 2024-03-15 DIAGNOSIS — Z3403 Encounter for supervision of normal first pregnancy, third trimester: Secondary | ICD-10-CM

## 2024-03-15 DIAGNOSIS — Z3A36 36 weeks gestation of pregnancy: Secondary | ICD-10-CM

## 2024-03-15 DIAGNOSIS — Z3685 Encounter for antenatal screening for Streptococcus B: Secondary | ICD-10-CM

## 2024-03-15 NOTE — Progress Notes (Signed)
 ROB doing well, denies concerns today. Leopold's completed , vertex position. Discussed SVE next visit should she want to have that done. Reviewed Labor precautions. Follow up 1 wk .   Zelda Hummer, CNM

## 2024-03-15 NOTE — Patient Instructions (Signed)

## 2024-03-17 LAB — STREP GP B NAA: Strep Gp B NAA: NEGATIVE

## 2024-03-18 LAB — CERVICOVAGINAL ANCILLARY ONLY
Chlamydia: NEGATIVE
Comment: NEGATIVE
Comment: NORMAL
Neisseria Gonorrhea: NEGATIVE

## 2024-03-21 NOTE — Progress Notes (Unsigned)
 Routine Prenatal Care Visit  Subjective  Kristina English is a 23 y.o. G1P0 at [redacted]w[redacted]d being seen today for ongoing prenatal care.  She is currently monitored for the following issues for this low-risk pregnancy and has Supervision of normal pregnancy; Late prenatal care; Childhood asthma; Not immune to rubella; Susceptible varicella; Labor and delivery, indication for care; and Shortness of breath on their problem list.  ----------------------------------------------------------------------------------- Patient reports she is doing well. Had an issue with bassinet she purchased from [place] with missing parts. They will not allow return or refund. Will message CM to put her in touch with possible resources.   Contractions: Not present. Vag. Bleeding: None.  Movement: Present. Leaking Fluid denies.  ----------------------------------------------------------------------------------- The following portions of the patient's history were reviewed and updated as appropriate: allergies, current medications, past family history, past medical history, past social history, past surgical history and problem list. Problem list updated.  Objective  BP 101/64   Pulse 96   Wt 146 lb (66.2 kg)   LMP 07/04/2023   BMI 30.51 kg/m  Pregravid weight 114 lb (51.7 kg) Total Weight Gain 32 lb (14.5 kg) Urinalysis: Urine Protein    Urine Glucose    Fetal Status: Fetal Heart Rate (bpm): 125 Fundal Height: 37 cm Movement: Present      General:  Alert, oriented and cooperative. Patient is in no acute distress.  Skin: Skin is warm and dry. No rash noted.   Cardiovascular: Normal heart rate noted  Respiratory: Normal respiratory effort, no problems with respiration noted  Abdomen: Soft, gravid, appropriate for gestational age. Pain/Pressure: Absent     Pelvic:  Cervical exam deferred        Extremities: Normal range of motion.  Edema: None  Mental Status: Normal mood and affect. Normal behavior. Normal judgment  and thought content.   Assessment   23 y.o. G1P0 at [redacted]w[redacted]d by  04/09/2024, by Last Menstrual Period presenting for routine prenatal visit  Plan   FIRST Problems (from 12/01/23 to present)     Problem Noted Diagnosed Resolved   Shortness of breath 02/13/2024 by Delinda Jinnie Jansky, CNM  No   Encounter for supervision of normal first pregnancy 12/01/2023 by Loralyn Sander, CMA  No   Overview Addendum 03/22/2024 10:52 AM by Loralyn Sander, CMA   Clinical Staff Provider  Office Location  [facility] Dating  04/18/2024, by Ultrasound  Language  English Anatomy US     Flu Vaccine  Declined Genetic Screen  NIPS: Neg/ Female  TDaP vaccine  Declined Hgb A1C or  GTT Early :5.2 Third trimester :   Covid Declined   LAB RESULTS   Rhogam  A/Positive/-- (07/09 1501)N/A Blood Type A/Positive/-- (07/09 1501)   RSV N/A Antibody  Negative  Feeding Plan Breastfeed Rubella  Non Immune  Contraception OCP RPR Non Reactive (07/21 1525)   Circumcision N/A HBsAg  Negative  Pediatrician  Undecided-7/9 HIV Non Reactive (07/21 1525)  Support Person Mom/partner Varicella  Non Immune  Prenatal Classes Yes GBS  (For PCN allergy, check sensitivities)     Hep C  Non Reactive  BTL Consent  Pap No results found for: DIAGPAP  VBAC Consent NA Hgb Electro      CF      SMA                    Term labor symptoms and general obstetric precautions including but not limited to vaginal bleeding, contractions, leaking of fluid and fetal movement were reviewed  in detail with the patient. Please refer to After Visit Summary for other counseling recommendations.   Return in about 1 week (around 03/29/2024) for rob.   Slater Rains, CNM [facility] 03/22/2024 11:39 AM

## 2024-03-22 ENCOUNTER — Ambulatory Visit (INDEPENDENT_AMBULATORY_CARE_PROVIDER_SITE_OTHER): Admitting: Advanced Practice Midwife

## 2024-03-22 ENCOUNTER — Encounter: Payer: Self-pay | Admitting: Advanced Practice Midwife

## 2024-03-22 VITALS — BP 101/64 | HR 96 | Wt 146.0 lb

## 2024-03-22 DIAGNOSIS — Z3A37 37 weeks gestation of pregnancy: Secondary | ICD-10-CM

## 2024-03-22 DIAGNOSIS — Z3403 Encounter for supervision of normal first pregnancy, third trimester: Secondary | ICD-10-CM | POA: Diagnosis not present

## 2024-03-22 NOTE — Patient Instructions (Signed)
 Signs and Symptoms of Labor Labor is the body's natural process of moving the baby and the placenta out of the uterus. The process of labor usually starts when the baby is full-term, between 26 and 41 weeks of pregnancy. Signs and symptoms that you are close to going into labor As your body prepares for labor and the birth of your baby, you may notice the following symptoms in the weeks and days before true labor starts: Passing a small amount of thick, bloody mucus from your vagina. This is called normal bloody show or losing your mucus plug. This may happen more than a week before labor begins, or right before labor begins, as the opening of the cervix starts to widen (dilate). For some women, the entire mucus plug passes at once. For others, pieces of the mucus plug may gradually pass over several days. Your baby moving (dropping) lower in your pelvis to get into position for birth (lightening). When this happens, you may feel more pressure on your bladder and pelvic bone and less pressure on your ribs. This may make it easier to breathe. It may also cause you to need to urinate more often and have problems with bowel movements. Having practice contractions, also called Braxton Hicks contractions or false labor. These occur at irregular (unevenly spaced) intervals that are more than 10 minutes apart. False labor contractions are common after exercise or sexual activity. They will stop if you change position, rest, or drink fluids. These contractions are usually mild and do not get stronger over time. They may feel like: A backache or back pain. Mild cramps, similar to menstrual cramps. Tightening or pressure in your abdomen. Other early symptoms include: Nausea or loss of appetite. Diarrhea. Having a sudden burst of energy, or feeling very tired. Mood changes. Having trouble sleeping. Signs and symptoms that labor has begun Signs that you are in labor may include: Having contractions that come  at regular (evenly spaced) intervals and increase in intensity. This may feel like more intense tightening or pressure in your abdomen that moves to your back. Contractions may also feel like rhythmic pain in your upper thighs or back that comes and goes at regular intervals. If you are delivering for the first time, this change in intensity of contractions often occurs at a more gradual pace. If you have given birth before, you may notice a more rapid progression of contraction changes. Feeling pressure in the vaginal area. Your water breaking (rupture of membranes). This is when the sac of fluid that surrounds your baby breaks. Fluid leaking from your vagina may be clear or blood-tinged. Labor usually starts within 24 hours of your water breaking, but it may take longer to begin. Some people may feel a sudden gush of fluid; others may notice repeatedly damp underwear. Follow these instructions at home:  When labor starts, or if your water breaks, call your health care provider or nurse care line. Based on your situation, they will determine when you should go in for an exam. During early labor, you may be able to rest and manage symptoms at home. Some strategies to try at home include: Breathing and relaxation techniques. Taking a warm bath or shower. Listening to music. Using a heating pad on the lower back for pain. If directed, apply heat to the area as often as told by your health care provider. Use the heat source that your health care provider recommends, such as a moist heat pack or a heating pad. Place a  towel between your skin and the heat source. Leave the heat on for 20-30 minutes. Remove the heat if your skin turns bright red. This is especially important if you are unable to feel pain, heat, or cold. You have a greater risk of getting burned. Contact a health care provider if: Your labor has started. Your water breaks. You have nausea, vomiting, or diarrhea. Get help right away  if: You have painful, regular contractions that are 5 minutes apart or less. Labor starts before you are [redacted] weeks along in your pregnancy. You have a fever. You have bright red blood coming from your vagina. You do not feel your baby moving. You have a severe headache with or without vision problems. You have chest pain or shortness of breath. These symptoms may represent a serious problem that is an emergency. Do not wait to see if the symptoms will go away. Get medical help right away. Call your local emergency services (911 in the [place] Do not drive yourself to the hospital. Summary Labor is your body's natural process of moving your baby and the placenta out of your uterus. The process of labor usually starts when your baby is full-term, between 25 and 40 weeks of pregnancy. When labor starts, or if your water breaks, call your health care provider or nurse care line. Based on your situation, they will determine when you should go in for an exam. This information is not intended to replace advice given to you by your health care provider. Make sure you discuss any questions you have with your health care provider. Document Revised: 10/27/2020 Document Reviewed: 10/27/2020 Elsevier Patient Education  2024 [employer]. Third Trimester of Pregnancy  The third trimester of pregnancy is from week 28 through week 40. This is months 7 through 9. The third trimester is a time when your baby is growing fast. Body changes during your third trimester Your body continues to change during this time. The changes usually go away after your baby is born. Physical changes You will continue to gain weight. You may get stretch marks on your hips, belly, and breasts. Your breasts will keep growing and may hurt. A yellow fluid (colostrum) may leak from your breasts. This is the first milk you're making for your baby. Your hair may grow faster and get thicker. In some cases, you may get hair loss. Your  belly button may stick out. You may have more swelling in your hands, face, or ankles. Health changes You may have heartburn. You may feel short of breath. This is caused by the uterus that is now bigger. You may have more aches in the pelvis, back, or thighs. You may have more tingling or numbness in your hands, arms, and legs. You may pee more often. You may have trouble pooping (constipation) or swollen veins in the butt that can itch or get painful (hemorrhoids). Other changes You may have more problems sleeping. You may notice the baby moving lower in your belly (dropping). You may have more fluid coming from your vagina. Your joints may feel loose, and you may have pain around your pelvic bone. Follow these instructions at home: Medicines Take medicines only as told by your health care provider. Some medicines are not safe during pregnancy. Your provider may change the medicines that you take. Do not take any medicines unless told to by your provider. Take a prenatal vitamin that has at least 600 micrograms (mcg) of folic acid. Do not use herbal medicines, illegal drugs,  or medicines that are not approved by your provider. Eating and drinking While you're pregnant your body needs additional nutrition to help support your growing baby. Talk with your provider about your nutritional needs. Activity Most women are able to exercise regularly during pregnancy. Exercise routines may need to change at the end of your pregnancy. Talk to your provider about your activities and exercise routine. Relieving pain and discomfort Rest often with your legs raised if you have leg cramps or low back pain. Take warm sitz baths to soothe pain from hemorrhoids. Use hemorrhoid cream if your provider says it's okay. Wear a good, supportive bra if your breasts hurt. Do not use hot tubs, steam rooms, or saunas. Do not douche. Do not use tampons or scented pads. Safety Talk to your provider before  traveling far distances. Wear your seatbelt at all times when you're in a car. Talk to your provider if someone hits you, hurts you, or yells at you. Preparing for birth To prepare for your baby: Take childbirth and breastfeeding classes. Visit the hospital and tour the maternity area. Buy a rear-facing car seat. Learn how to install it in your car. General instructions Avoid cat litter boxes and soil used by cats. These things carry germs that can cause harm to your pregnancy and your baby. Do not drink alcohol, smoke, vape, or use products with nicotine or tobacco in them. If you need help quitting, talk with your provider. Keep all follow-up visits for your third trimester. Your provider will do more exams and tests during this trimester. Write down your questions. Take them to your prenatal visits. Your provider also will: Talk with you about your overall health. Give you advice or refer you to specialists who can help with different needs, including: Mental health and counseling. Foods and healthy eating. Ask for help if you need help with food. Where to find more information American Pregnancy Association: americanpregnancy.org [employer]: acog.org Office on [employer] Health: [employer] Contact a health care provider if: You have a headache that does not go away when you take medicine. You have any of these problems: You can't eat or drink. You have nausea and vomiting. You have watery poop (diarrhea) for 2 days or more. You have pain when you pee, or your pee smells bad. You have been sick for 2 days or more and aren't getting better. Contact your provider right away if: You have any of these coming from your vagina: Abnormal discharge. Bad-smelling fluid. Bleeding. Your baby is moving less than usual. You have signs of labor: You have any contractions, belly cramping, or have pain in your pelvis or lower back before 37 weeks of  pregnancy (preterm labor). You have regular contractions that are less than 5 minutes apart. Your water breaks. You have symptoms of high blood pressure or preeclampsia. These include: A severe, throbbing headache that does not go away. Sudden or extreme swelling of your face, hands, legs, or feet. Vision problems: You see spots. You have blurry vision. Your eyes are sensitive to light. If you can't reach your provider, go to an urgent care or emergency room. Get help right away if: You faint, become confused, or can't think clearly. You have chest pain or trouble breathing. You have any kind of injury, such as from a fall or a car crash. These symptoms may be an emergency. Call 911 right away. Do not wait to see if the symptoms will go away. Do not drive yourself  to the hospital. This information is not intended to replace advice given to you by your health care provider. Make sure you discuss any questions you have with your health care provider. Document Revised: 03/16/2023 Document Reviewed: 10/14/2022 Elsevier Patient Education  2024 [employer].

## 2024-03-27 NOTE — Patient Instructions (Signed)
 Fetal Movement Counts When you're pregnant, you might start feeling your baby move around the middle of your pregnancy. At first, these movements might feel like flutters, rolls, or swishes. As your baby grows, you might feel more kicks and jabs. Around week 28 of your pregnancy, your health care team may ask you to count how often your baby moves. This is important for all pregnancies, but especially for high-risk ones. Counting movements can help lessen the risk of stillbirth. What is a fetal movement count? A fetal movement count is the number of times that you feel your baby move during a certain amount of time. This may also be called a kick count. There are many ways to do a kick count. Ask your team what is best for you. Pay attention to when your baby is most active. You may notice your baby's sleep and wake cycles. You may also notice things that make your baby move more. When you do a kick count, try to do it: When your baby is normally most active. At the same time each day. How do I count fetal movements?  Find a quiet, comfortable area. Sit or lie down. Write down the date, the start time, and the number of movements you feel. Count kicks, flutters, swishes, rolls, and jabs. Usually, you will feel at least 10 movements within 2 hours. Stop counting after you have felt 10 movements or if you have been counting for 2 hours. Write down the stop time. Contact a health care provider if: You don't feel 10 movements in 2 hours. Your baby isn't moving as it usually does. Your baby isn't moving at all. If you're not able to reach your provider, go to an emergency room. This information is not intended to replace advice given to you by your health care provider. Make sure you discuss any questions you have with your health care provider. Document Revised: 07/07/2023 Document Reviewed: 06/29/2022 Elsevier Patient Education  2025 Else[employer] Inc.Third Trimester of Pregnancy  The third trimester  of pregnancy is from week 28 through week 40. This is months 7 through 9. The third trimester is a time when your baby is growing fast. Body changes during your third trimester Your body continues to change during this time. The changes usually go away after your baby is born. Physical changes You will continue to gain weight. You may get stretch marks on your hips, belly, and breasts. Your breasts will keep growing and may hurt. A yellow fluid (colostrum) may leak from your breasts. This is the first milk you're making for your baby. Your hair may grow faster and get thicker. In some cases, you may get hair loss. Your belly button may stick out. You may have more swelling in your hands, face, or ankles. Health changes You may have heartburn. You may feel short of breath. This is caused by the uterus that is now bigger. You may have more aches in the pelvis, back, or thighs. You may have more tingling or numbness in your hands, arms, and legs. You may pee more often. You may have trouble pooping (constipation) or swollen veins in the butt that can itch or get painful (hemorrhoids). Other changes You may have more problems sleeping. You may notice the baby moving lower in your belly (dropping). You may have more fluid coming from your vagina. Your joints may feel loose, and you may have pain around your pelvic bone. Follow these instructions at home: Medicines Take medicines only as told by  your health care provider. Some medicines are not safe during pregnancy. Your provider may change the medicines that you take. Do not take any medicines unless told to by your provider. Take a prenatal vitamin that has at least 600 micrograms (mcg) of folic acid. Do not use herbal medicines, illegal drugs, or medicines that are not approved by your provider. Eating and drinking While you're pregnant your body needs additional nutrition to help support your growing baby. Talk with your provider about  your nutritional needs. Activity Most women are able to exercise regularly during pregnancy. Exercise routines may need to change at the end of your pregnancy. Talk to your provider about your activities and exercise routine. Relieving pain and discomfort Rest often with your legs raised if you have leg cramps or low back pain. Take warm sitz baths to soothe pain from hemorrhoids. Use hemorrhoid cream if your provider says it's okay. Wear a good, supportive bra if your breasts hurt. Do not use hot tubs, steam rooms, or saunas. Do not douche. Do not use tampons or scented pads. Safety Talk to your provider before traveling far distances. Wear your seatbelt at all times when you're in a car. Talk to your provider if someone hits you, hurts you, or yells at you. Preparing for birth To prepare for your baby: Take childbirth and breastfeeding classes. Visit the hospital and tour the maternity area. Buy a rear-facing car seat. Learn how to install it in your car. General instructions Avoid cat litter boxes and soil used by cats. These things carry germs that can cause harm to your pregnancy and your baby. Do not drink alcohol, smoke, vape, or use products with nicotine  or tobacco in them. If you need help quitting, talk with your provider. Keep all follow-up visits for your third trimester. Your provider will do more exams and tests during this trimester. Write down your questions. Take them to your prenatal visits. Your provider also will: Talk with you about your overall health. Give you advice or refer you to specialists who can help with different needs, including: Mental health and counseling. Foods and healthy eating. Ask for help if you need help with food. Where to find more information American Pregnancy Association: americanpregnancy.org [employer]ians [employer]necologists: acog.org Office on [employer] Health: [employer] Contact a health care provider if: You  have a headache that does not go away when you take medicine. You have any of these problems: You can't eat or drink. You have nausea and vomiting. You have watery poop (diarrhea) for 2 days or more. You have pain when you pee, or your pee smells bad. You have been sick for 2 days or more and aren't getting better. Contact your provider right away if: You have any of these coming from your vagina: Abnormal discharge. Bad-smelling fluid. Bleeding. Your baby is moving less than usual. You have signs of labor: You have any contractions, belly cramping, or have pain in your pelvis or lower back before 37 weeks of pregnancy (preterm labor). You have regular contractions that are less than 5 minutes apart. Your water breaks. You have symptoms of high blood pressure or preeclampsia. These include: A severe, throbbing headache that does not go away. Sudden or extreme swelling of your face, hands, legs, or feet. Vision problems: You see spots. You have blurry vision. Your eyes are sensitive to light. If you can't reach your provider, go to an urgent care or emergency room. Get help right away if: You faint, become  confused, or can't think clearly. You have chest pain or trouble breathing. You have any kind of injury, such as from a fall or a car crash. These symptoms may be an emergency. Call 911 right away. Do not wait to see if the symptoms will go away. Do not drive yourself to the hospital. This information is not intended to replace advice given to you by your health care provider. Make sure you discuss any questions you have with your health care provider. Document Revised: 03/16/2023 Document Reviewed: 10/14/2022 Elsevier Patient Education  2024 [employer].

## 2024-03-27 NOTE — Progress Notes (Unsigned)
    Return Prenatal Note   Subjective   23 y.o. G1P0 at [redacted]w[redacted]d presents for this follow-up prenatal visit.  She is doing well. She has good fetal movement. She reports she did get dizzy today for about 15 minutes, then it resolved. Discussed orthostatic hypotension and self help measures.  Patient reports:  Movement: Present Contractions: Not present  Objective   Flow sheet Vitals: Pulse Rate: 96 BP: 120/76 Total weight gain: 35 lb 6.4 oz (16.1 kg)  General Appearance  No acute distress, well appearing, and well nourished Pulmonary   Normal work of breathing Neurologic   Alert and oriented to person, place, and time Psychiatric   Mood and affect within normal limits   Assessment/Plan   Plan  23 y.o. G1P0 at [redacted]w[redacted]d presents for follow-up OB visit. Reviewed prenatal record including previous visit note.  Reviewed labor precautions . All questions answered . Discussed induction if noted delivered by 41 weeks.   No problem-specific Assessment & Plan notes found for this encounter.   No orders of the defined types were placed in this encounter.  No follow-ups on file.   Future Appointments  Date Time Provider Department Center  04/05/2024 10:15 AM Dominic, Jinnie Jansky, CNM [facility] None    For next visit:  continue with routine prenatal care   1 wk for ROB   Zelda Hummer, CNM   OB/GYN 03/29/2509:33 AM

## 2024-03-29 ENCOUNTER — Ambulatory Visit: Admitting: Certified Nurse Midwife

## 2024-03-29 ENCOUNTER — Encounter: Payer: Self-pay | Admitting: Certified Nurse Midwife

## 2024-03-29 VITALS — BP 120/76 | HR 96 | Wt 149.4 lb

## 2024-03-29 DIAGNOSIS — Z3483 Encounter for supervision of other normal pregnancy, third trimester: Secondary | ICD-10-CM | POA: Diagnosis not present

## 2024-03-29 DIAGNOSIS — Z3A38 38 weeks gestation of pregnancy: Secondary | ICD-10-CM | POA: Diagnosis not present

## 2024-04-01 ENCOUNTER — Encounter: Payer: Self-pay | Admitting: Licensed Practical Nurse

## 2024-04-05 ENCOUNTER — Encounter: Payer: Self-pay | Admitting: Licensed Practical Nurse

## 2024-04-05 ENCOUNTER — Ambulatory Visit: Admitting: Licensed Practical Nurse

## 2024-04-05 VITALS — BP 125/80 | HR 94 | Wt 149.7 lb

## 2024-04-05 DIAGNOSIS — Z3A39 39 weeks gestation of pregnancy: Secondary | ICD-10-CM | POA: Diagnosis not present

## 2024-04-05 DIAGNOSIS — Z3403 Encounter for supervision of normal first pregnancy, third trimester: Secondary | ICD-10-CM

## 2024-04-05 NOTE — Assessment & Plan Note (Addendum)
-  Her mother will be her labor support, has family nearby for [facility] support.  -IOL scheduled 10/22 at 0800, aware it may be postponed based on unit needs, orders placed -Discussed Nst and possible membrane sweep at next visit.  -warning signs reviewed

## 2024-04-05 NOTE — Progress Notes (Signed)
    Return Prenatal Note   Subjective   23 y.o. G1P0 at [redacted]w[redacted]d presents for this follow-up prenatal visit.  Patient here with her mother Patient reports: Doing well, has had some vaginal and lower abdominal pressure-does not  think she has contractions but feels occasional cramping.  -has watched videos online related to birth, does not want an epiduralunder any circumstance and would prefer to labor as natural as possible:, reassured we support her birthing choices. pt would also like to discuss induction. Rec IOL at 41 weeks, scheduled on 10/22 at 0800, reviewed method of induction, which based on cervical exam, she can expect to start with a ripening balloon and Cytotec. Pt then asked and you are not going to given me anything for pain? Discussed pain management options in labor including IV, Nitrous oxide and epidural. By end of the conversation pt may be open to labor epidural if needed.  Movement: Present Contractions: Irritability  Objective   Flow sheet Vitals: Pulse Rate: 94 BP: 125/80 Fundal Height: 37 cm Fetal Heart Rate (bpm): 125 Dilation: 1 Effacement (%): 50 Station: -2 Total weight gain: 35 lb 11.2 oz (16.2 kg)  General Appearance  No acute distress, well appearing, and well nourished Pulmonary   Normal work of breathing Neurologic   Alert and oriented to person, place, and time Psychiatric   Mood and affect within normal limits   Assessment/Plan   Plan  23 y.o. G1P0 at [redacted]w[redacted]d presents for follow-up OB visit. Reviewed prenatal record including previous visit note.  Supervision of normal pregnancy -Her mother will be her labor support, has family nearby for [facility] support.  -IOL scheduled 10/22 at 0800, aware it may be postponed based on unit needs, orders placed -Discussed Nst and possible membrane sweep at next visit.  -warning signs reviewed       No orders of the defined types were placed in this encounter.  Return in about 1 week (around 04/12/2024)  for ROB, NST.   Future Appointments  Date Time Provider Department Center  04/11/2024  3:50 PM [facility]OB[facility] ROOM [facility] None  04/11/2024  4:15 PM Swanson, Eleanor HERO, CNM [facility] None    For next visit:  ROB with NST     Dashun Borre [facility], CNM  10/10/255:06 PM

## 2024-04-08 ENCOUNTER — Other Ambulatory Visit: Payer: Self-pay

## 2024-04-08 ENCOUNTER — Observation Stay
Admission: EM | Admit: 2024-04-08 | Discharge: 2024-04-08 | Disposition: A | Discharge status: Home / Self Care | Attending: Obstetrics | Admitting: Obstetrics

## 2024-04-08 DIAGNOSIS — Z3A39 39 weeks gestation of pregnancy: Secondary | ICD-10-CM

## 2024-04-08 DIAGNOSIS — O368130 Decreased fetal movements, third trimester, not applicable or unspecified: Principal | ICD-10-CM

## 2024-04-08 MED ORDER — ACETAMINOPHEN 325 MG PO TABS: 650.0000 mg | ORAL_TABLET | ORAL | Status: DC | PRN

## 2024-04-08 NOTE — Discharge Summary (Signed)
 LABOR & DELIVERY OB TRIAGE NOTE  SUBJECTIVE  HPI Kristina English is a 23 y.o. G1P0 at [redacted]w[redacted]d who presents to Labor & Delivery for decreased fetal movement. She denies LOF, ctx, and vaginal bleeding. While in triage, she began feeling the baby move.  OB History     Gravida  1   Para      Term      Preterm      AB      Living         SAB      IAB      Ectopic      Multiple      Live Births              Scheduled Meds: Continuous Infusions: PRN Meds:.acetaminophen  OBJECTIVE  BP 120/80 (BP Location: Left Arm)   Pulse (!) 114   Temp 98 F (36.7 C) (Oral)   Resp 16   LMP 07/04/2023    NST I reviewed the NST and it was reactive.  Baseline: 130 Variability: moderate Accelerations: present Decelerations:none Toco: rare Category 1  ASSESSMENT Impression  1) Pregnancy at G1P0, [redacted]w[redacted]d, Estimated Date of Delivery: 04/09/24 2) Reassuring maternal/fetal status  PLAN 1) Discharge home with standard labor/return precautions 2) Keep scheduled ROB appt  Eleanor Canny, CNM 04/08/24  1:51 AM

## 2024-04-08 NOTE — OB Triage Note (Signed)
 Patient presented to [facility] triage for decreased fetal movements. Pt denies any contractions, leaking of fluid, and vaginal bleeding. Pt reports trying cold drinks and eating prior; states still hasn't felt movement.

## 2024-04-11 ENCOUNTER — Inpatient Hospital Stay
Admission: EM | Admit: 2024-04-11 | Discharge: 2024-04-14 | DRG: 807 | Disposition: A | Discharge status: Home / Self Care | Attending: Registered Nurse | Admitting: Registered Nurse

## 2024-04-11 ENCOUNTER — Other Ambulatory Visit

## 2024-04-11 ENCOUNTER — Encounter: Admitting: Obstetrics

## 2024-04-11 ENCOUNTER — Other Ambulatory Visit: Payer: Self-pay

## 2024-04-11 DIAGNOSIS — Z3490 Encounter for supervision of normal pregnancy, unspecified, unspecified trimester: Secondary | ICD-10-CM | POA: Diagnosis present

## 2024-04-11 DIAGNOSIS — O26893 Other specified pregnancy related conditions, third trimester: Secondary | ICD-10-CM | POA: Diagnosis present

## 2024-04-11 DIAGNOSIS — R0602 Shortness of breath: Secondary | ICD-10-CM

## 2024-04-11 DIAGNOSIS — Z3403 Encounter for supervision of normal first pregnancy, third trimester: Principal | ICD-10-CM

## 2024-04-11 DIAGNOSIS — Z789 Other specified health status: Secondary | ICD-10-CM | POA: Diagnosis present

## 2024-04-11 DIAGNOSIS — Z3A40 40 weeks gestation of pregnancy: Secondary | ICD-10-CM

## 2024-04-11 LAB — CBC
HCT: 32 % — ABNORMAL LOW (ref 36.0–46.0)
Hemoglobin: 10.3 g/dL — ABNORMAL LOW (ref 12.0–15.0)
MCH: 27.5 pg (ref 26.0–34.0)
MCHC: 32.2 g/dL (ref 30.0–36.0)
MCV: 85.3 fL (ref 80.0–100.0)
Platelets: 351 K/uL (ref 150–400)
RBC: 3.75 MIL/uL — ABNORMAL LOW (ref 3.87–5.11)
RDW: 13.3 % (ref 11.5–15.5)
WBC: 18.5 K/uL — ABNORMAL HIGH (ref 4.0–10.5)
nRBC: 0 % (ref 0.0–0.2)

## 2024-04-11 LAB — ABO/RH: ABO/RH(D): A POS

## 2024-04-11 LAB — TYPE AND SCREEN
ABO/RH(D): A POS
Antibody Screen: NEGATIVE

## 2024-04-11 MED ORDER — ONDANSETRON HCL 4 MG/2ML IJ SOLN: 4.0000 mg | Freq: Four times a day (QID) | INTRAMUSCULAR | Status: DC | PRN

## 2024-04-11 MED ORDER — OXYTOCIN BOLUS FROM INFUSION
333.0000 mL | Freq: Once | INTRAVENOUS | Status: AC
  Administered 2024-04-12: 333 mL via INTRAVENOUS

## 2024-04-11 MED ORDER — OXYTOCIN-SODIUM CHLORIDE 30-0.9 UT/500ML-% IV SOLN: 1.0000 m[IU]/min | INTRAVENOUS | Status: DC

## 2024-04-11 MED ORDER — MISOPROSTOL 25 MCG QUARTER TABLET: 25.0000 ug | ORAL_TABLET | Freq: Once | ORAL | Status: DC

## 2024-04-11 MED ORDER — LACTATED RINGERS IV SOLN: INTRAVENOUS | Status: DC

## 2024-04-11 MED ORDER — MISOPROSTOL 25 MCG QUARTER TABLET: 25.0000 ug | ORAL_TABLET | ORAL | Status: DC | PRN

## 2024-04-11 MED ORDER — OXYTOCIN-SODIUM CHLORIDE 30-0.9 UT/500ML-% IV SOLN
INTRAVENOUS | Status: AC
  Administered 2024-04-12: 2 m[IU]/min via INTRAVENOUS
  Filled 2024-04-11: qty 500

## 2024-04-11 MED ORDER — SOD CITRATE-CITRIC ACID 500-334 MG/5ML PO SOLN: 30.0000 mL | ORAL | Status: DC | PRN

## 2024-04-11 MED ORDER — TERBUTALINE SULFATE 1 MG/ML IJ SOLN: 0.2500 mg | Freq: Once | INTRAMUSCULAR | Status: DC | PRN

## 2024-04-11 MED ORDER — OXYTOCIN-SODIUM CHLORIDE 30-0.9 UT/500ML-% IV SOLN
2.5000 [IU]/h | INTRAVENOUS | Status: DC
  Administered 2024-04-12: 2.5 [IU]/h via INTRAVENOUS

## 2024-04-11 MED ORDER — LIDOCAINE HCL (PF) 1 % IJ SOLN
30.0000 mL | INTRAMUSCULAR | Status: AC | PRN
  Administered 2024-04-12: 30 mL via SUBCUTANEOUS

## 2024-04-11 MED ORDER — FENTANYL CITRATE (PF) 100 MCG/2ML IJ SOLN
50.0000 ug | INTRAMUSCULAR | Status: DC | PRN
  Administered 2024-04-12 (×4): 100 ug via INTRAVENOUS
  Filled 2024-04-11 (×4): qty 2

## 2024-04-11 MED ORDER — MISOPROSTOL 50MCG HALF TABLET: 50.0000 ug | ORAL_TABLET | Freq: Once | ORAL | Status: DC

## 2024-04-11 MED ORDER — LACTATED RINGERS IV SOLN: 500.0000 mL | INTRAVENOUS | Status: DC | PRN

## 2024-04-11 NOTE — Progress Notes (Signed)
 Kristina English is a 23 y.o. G1P0 at [redacted]w[redacted]d by LMP admitted for spontaneous labor.  Subjective: Kristina English is doing well, she is breathing well through her contractions. She has been resting on and off with the peanut ball and plans to go for a walk.   Objective: BP (!) 100/51 (BP Location: Right Arm)   Pulse 99   Temp 98.3 F (36.8 C) (Oral)   Resp 18   Ht 4' 11 (1.499 m)   Wt 68 kg   LMP 07/04/2023   BMI 30.30 kg/m  No intake/output data recorded. No intake/output data recorded.  FHT:  FHR: 130 bpm, variability: moderate,  accelerations:  Present,  decelerations:  Absent UC:   regular, every 2-4 minutes SVE:   Dilation: 5 Effacement (%): 80 Station: -2 Exam by:: A Scot, SNM  Labs: Lab Results  Component Value Date   WBC 18.5 (H) 04/11/2024   HGB 10.3 (L) 04/11/2024   HCT 32.0 (L) 04/11/2024   MCV 85.3 04/11/2024   PLT 351 04/11/2024    Assessment / Plan: Spontaneous labor, progressing normally  Labor: Progressing normally Fetal Wellbeing:  Category I Pain Control:  Labor support without medications I/D:  GBS neg, Membranes intact Anticipated MOD:  NSVD  Rosina Hamilton, Student-MidWife 04/11/2024, 6:41 PM

## 2024-04-11 NOTE — H&P (Signed)
 Kristina English is a 23 y.o. female presenting for labor. Patient was late to prenatal care, starting care at 22 weeks. This pregnancy has been uncomplicated.   OB History     Gravida  1   Para      Term      Preterm      AB      Living         SAB      IAB      Ectopic      Multiple      Live Births             Past Medical History:  Diagnosis Date   History of low potassium    Past Surgical History:  Procedure Laterality Date   NO PAST SURGERIES     Family History: family history is not on file. Social History:  reports that she has never smoked. She has never used smokeless tobacco. She reports that she does not drink alcohol and does not use drugs.     Maternal Diabetes: No Genetic Screening: Normal Maternal Ultrasounds/Referrals: Normal Fetal Ultrasounds or other Referrals:  None Maternal Substance Abuse:  No Significant Maternal Medications:  None Significant Maternal Lab Results:  None Number of Prenatal Visits:greater than 3 verified prenatal visits Maternal Vaccinations: declined Other Comments:  None  Review of Systems  Constitutional: Negative.   HENT: Negative.    Eyes: Negative.   Respiratory: Negative.    Cardiovascular: Negative.   Gastrointestinal: Negative.   Endocrine: Negative.   Genitourinary: Negative.   Musculoskeletal: Negative.   Skin: Negative.   Allergic/Immunologic: Negative.   Neurological: Negative.   Hematological: Negative.   Psychiatric/Behavioral: Negative.     History Dilation: 4 Effacement (%): 80 Station: -2 Exam by:: Set designer Blood pressure 127/80, pulse (!) 112, temperature 97.9 F (36.6 C), temperature source Oral, resp. rate 18, height 4' 11 (1.499 m), weight 68 kg, last menstrual period 07/04/2023.  Exam Physical Exam HENT:     Head: Normocephalic and atraumatic.  Cardiovascular:     Rate and Rhythm: Normal rate and regular rhythm.     Heart sounds: Normal heart sounds.  Pulmonary:      Effort: Pulmonary effort is normal.  Abdominal:     Comments: Gravid, EFW 6lb, Vertex by leopold's   Skin:    General: Skin is warm and dry.  Neurological:     General: No focal deficit present.     Mental Status: She is alert.  Psychiatric:        Mood and Affect: Mood normal.    Dilation: 4 Effacement (%): 80 Station: -2 Presentation: Vertex Exam by:: Mancil RN   Prenatal labs: ABO, Rh: A/Positive/-- (07/09 1501) Antibody:   Rubella: Nonimmune (07/29 0000) RPR: Non Reactive (07/21 1525)  HBsAg: Negative (07/29 0000)  HIV: Non Reactive (07/21 1525)  GBS: Negative/-- (09/19 1616)   Assessment/Plan: G1P0 [redacted]w[redacted]d admitted for spontaneous labor.  - Admit to labor and delivery, expectant management.  - Reactive NST, intermittent monitoring ordered.  - GBS neg  - desires to avoid epidural, may use other labor coping strategies as desired.   JINNIE HERO [facility] 04/11/2024, 2:21 PM

## 2024-04-11 NOTE — OB Triage Note (Signed)
 Patient is a 23 yo, G1P0, at 40 weeks 2 days. Patient presents with complaints of contractions. Patient states she started feeling ctx around 0200 this morning and she noticed a change in frequency and intensity around 0700. Patient rates the contraction pain 8/10 on the pain scale. Patient denies any vaginal bleeding or LOF. Patient reports +FM. Monitors applied and assessing. VSS. Initial fetal heart tone 140. Dominic CNM notified of patients arrival to unit. Plan to place in obs for labor check. SVE unchanged from office. 3cm external with cervical funneling 0.5 internal.

## 2024-04-11 NOTE — Progress Notes (Signed)
 Kristina English is a 23 y.o. G1P0 at [redacted]w[redacted]d by LMP admitted for spontaneous labor.   Subjective: Kristina English is doing well she reports her contractions are more intense, she walked around for an hour and is now resting on her side using the peanut ball.   Objective: BP 117/69 (BP Location: Right Arm)   Pulse 100   Temp 98 F (36.7 C) (Oral)   Resp 17   Ht 4' 11 (1.499 m)   Wt 68 kg   LMP 07/04/2023   BMI 30.30 kg/m  No intake/output data recorded. No intake/output data recorded.  FHT:  FHR: 140 bpm, variability: moderate,  accelerations:  Present,  decelerations:  Absent UC:   regular, every 5-6 minutes SVE:   Dilation: 5.5 Effacement (%): 80 Station: -2 Exam by:: A Scot SNM  Labs: Lab Results  Component Value Date   WBC 18.5 (H) 04/11/2024   HGB 10.3 (L) 04/11/2024   HCT 32.0 (L) 04/11/2024   MCV 85.3 04/11/2024   PLT 351 04/11/2024    Assessment / Plan: Spontaneous labor, latent labor   Labor: Has not made significant change since last exam, will recheck in 4 hours and will discuss augmentation at next exam. Fetal Wellbeing:  Category I Pain Control:  Labor support without medications I/D:  GBS neg, Membranes intact Anticipated MOD:  NSVD  Rosina Hamilton, Student-MidWife 04/11/2024, 10:49 PM

## 2024-04-11 NOTE — OB Triage Note (Cosign Needed Addendum)
 Subjective: Kristina English presents today with complaints of contractions that started around 0200. Around 0700 they became more intense and she now rates them a 9/10 in intensity. She denies LOF, VB. She has been feeling baby move.   Objective:  BP 127/80 (BP Location: Right Arm)   Pulse (!) 112   Temp 97.9 F (36.6 C) (Oral)   Resp 18   Ht 4' 11 (1.499 m)   Wt 68 kg   LMP 07/04/2023   BMI 30.30 kg/m   SVE: external os 3, internal os closed. Checked by [employer].  Assessment/Plan:  G1P0 [redacted]w[redacted]d here in early labor.   - Discussed discharge home vs walking around th hospital and a recheck in 2 hours. Patient ultimately decided to walk around and be rechecked in 2 hours.   Rosina Hamilton, Student Midwife 04/11/2024 12:52

## 2024-04-12 ENCOUNTER — Encounter: Payer: Self-pay | Admitting: Obstetrics and Gynecology

## 2024-04-12 LAB — RPR: RPR Ser Ql: NONREACTIVE

## 2024-04-12 MED ORDER — DIPHENHYDRAMINE HCL 25 MG PO CAPS: 25.0000 mg | ORAL_CAPSULE | Freq: Four times a day (QID) | ORAL | Status: DC | PRN

## 2024-04-12 MED ORDER — TETANUS-DIPHTH-ACELL PERTUSSIS 5-2-15.5 LF-MCG/0.5 IM SUSP: 0.5000 mL | Freq: Once | INTRAMUSCULAR | Status: DC

## 2024-04-12 MED ORDER — DIBUCAINE (PERIANAL) 1 % EX OINT
1.0000 | TOPICAL_OINTMENT | CUTANEOUS | Status: DC | PRN
  Filled 2024-04-12: qty 28

## 2024-04-12 MED ORDER — ACETAMINOPHEN 325 MG PO TABS: 650.0000 mg | ORAL_TABLET | ORAL | Status: DC | PRN

## 2024-04-12 MED ORDER — PRENATAL MULTIVITAMIN CH
1.0000 | ORAL_TABLET | Freq: Every day | ORAL | Status: DC
  Administered 2024-04-13 – 2024-04-14 (×2): 1 via ORAL
  Filled 2024-04-12 (×2): qty 1

## 2024-04-12 MED ORDER — WITCH HAZEL-GLYCERIN EX PADS
1.0000 | MEDICATED_PAD | CUTANEOUS | Status: DC | PRN
  Filled 2024-04-12: qty 100

## 2024-04-12 MED ORDER — IBUPROFEN 600 MG PO TABS
600.0000 mg | ORAL_TABLET | Freq: Four times a day (QID) | ORAL | Status: DC
  Administered 2024-04-12 – 2024-04-14 (×4): 600 mg via ORAL
  Filled 2024-04-12 (×5): qty 1

## 2024-04-12 MED ORDER — ZOLPIDEM TARTRATE 5 MG PO TABS: 5.0000 mg | ORAL_TABLET | Freq: Every evening | ORAL | Status: DC | PRN

## 2024-04-12 MED ORDER — SIMETHICONE 80 MG PO CHEW: 80.0000 mg | CHEWABLE_TABLET | ORAL | Status: DC | PRN

## 2024-04-12 MED ORDER — MEASLES, MUMPS & RUBELLA VAC ~~LOC~~ SUSR: 0.5000 mL | Freq: Once | SUBCUTANEOUS | Status: DC

## 2024-04-12 MED ORDER — DOCUSATE SODIUM 100 MG PO CAPS
100.0000 mg | ORAL_CAPSULE | Freq: Two times a day (BID) | ORAL | Status: DC
  Administered 2024-04-13 – 2024-04-14 (×3): 100 mg via ORAL
  Filled 2024-04-12 (×3): qty 1

## 2024-04-12 MED ORDER — ONDANSETRON HCL 4 MG PO TABS: 4.0000 mg | ORAL_TABLET | ORAL | Status: DC | PRN

## 2024-04-12 MED ORDER — COCONUT OIL OIL
1.0000 | TOPICAL_OIL | Status: DC | PRN
  Filled 2024-04-12 (×2): qty 7.5

## 2024-04-12 MED ORDER — BENZOCAINE-MENTHOL 20-0.5 % EX AERO
1.0000 | INHALATION_SPRAY | CUTANEOUS | Status: DC | PRN
  Filled 2024-04-12: qty 56

## 2024-04-12 MED ORDER — ONDANSETRON HCL 4 MG/2ML IJ SOLN: 4.0000 mg | INTRAMUSCULAR | Status: DC | PRN

## 2024-04-12 NOTE — Progress Notes (Signed)
 Labor Progress Note   ASSESSMENT/PLAN   Kristina English 23 y.o.   G1P0  at [redacted]w[redacted]d admited in spontaneous labor.  FWB:  - Fetal well being assessed: Category 1        GBS: - GBS: negative  LABOR: - Now in active phase with continued slow progress  (1cm in 4 hrs). ROM x 7 hrs. Recommend pitocin augmentation. Pt in agreement.  - Pain Management: IV pain medication. Has had fentanyl IV x2. Says it's helping a bit. Really wants to avoid an epidural. - Discussed options with patient and will start IV Pitocin augmentation. - Dr Davis Updated. - Anticipate SVD   Principal Problem:   Encounter for induction of labor     SUBJECTIVE/OBJECTIVE   SUBJECTIVE:  Says fentanyl has helped a little. Has had two doses. Really wants to avoid epidural. Mom at bedside and supportive.   OBJECTIVE: Vital Signs: Patient Vitals for the past 12 hrs:  BP Temp Temp src Pulse Resp  04/12/24 0954 -- 98.2 F (36.8 C) Oral -- 16  04/12/24 0808 125/78 98 F (36.7 C) Oral (!) 131 16  04/12/24 0102 108/61 98.1 F (36.7 C) Oral (!) 103 18    Last SVE:  Dilation: 7 (04/12/24 1007) Effacement (%): 80 (04/12/24 0604) Station: -2 (04/12/24 0604) Presentation: Vertex (04/12/24 1007) Exam by:: Charma, CNM (04/12/24 1007)  Membranes Sac Identifier: Sac 1 Membrane Status: AROM Rupture Date: 04/12/24 Rupture Time: 0308 Color: Clear Odor: Normal Amount: None  FHR:   - Mode: External  - Baseline Rate (A): 135 bpm  - Variability: Minimal, Moderate  - Characteristics (ie - accels, decels): Accelerations: 15 x 15, Decelerations: None  UTERINE ACTIVITY:   - Mode: Toco  - Contraction Frequency (min): 3.5-4 minutes  Pitocin: to start

## 2024-04-12 NOTE — Progress Notes (Signed)
 Kristina English is a 23 y.o. G1P0 at [redacted]w[redacted]d by LMP admitted for spontaneous labor.  Subjective: Kristina English is doing well, she has been off and on resting and walking, coping well.   Objective: BP 108/61 (BP Location: Right Arm)   Pulse (!) 103   Temp 98.1 F (36.7 C) (Oral)   Resp 18   Ht 4' 11 (1.499 m)   Wt 68 kg   LMP 07/04/2023   BMI 30.30 kg/m  No intake/output data recorded. No intake/output data recorded.  FHT:  FHR: 135 bpm, variability: moderate,  accelerations:  Present,  decelerations:  Absent UC:   regular, every 2-4 minutes SVE:   Dilation: 5.5 Effacement (%): 80 Station: -2 Exam by:: [employer] SNM  Labs: Lab Results  Component Value Date   WBC 18.5 (H) 04/11/2024   HGB 10.3 (L) 04/11/2024   HCT 32.0 (L) 04/11/2024   MCV 85.3 04/11/2024   PLT 351 04/11/2024    Assessment / Plan: Spontaneous Labor; latent labor   Labor: Exam unchanged, AROM  Fetal Wellbeing:  Category I Pain Control:  Labor support without medications I/D:  GBS Neg, ROM clr @0308  Anticipated MOD:  NSVD  Rosina Hamilton, Student-MidWife 04/12/2024, 3:12 AM

## 2024-04-12 NOTE — Discharge Summary (Signed)
 Postpartum Discharge Summary  Date of Service updated***     Patient Name: Kristina English DOB: [birth year] MRN: [id number]  Date of admission: 04/11/2024 Delivery date:04/12/2024 Delivering provider: CHARMA DOMINO Date of discharge: 04/12/2024  Admitting diagnosis: Spontaneous labor Intrauterine pregnancy: [redacted]w[redacted]d     Secondary diagnosis:  Principal Problem:   SVD (spontaneous vaginal delivery)  Additional problems: Late entry to prenatal care    Discharge diagnosis: Term Pregnancy Delivered                                              Post partum procedures:None Augmentation: AROM and Pitocin Complications: None  Hospital course: Onset of Labor With Vaginal Delivery      23 y.o. yo G1P1001 at [redacted]w[redacted]d was admitted in Latent Labor on 04/11/2024. Labor course was complicated by prolonged latent phase. Received AROM and pitocin. Active phase was more linear.   Membrane Rupture Time/Date: 3:08 AM,04/12/2024  Delivery Method:Vaginal, Spontaneous Operative Delivery:N/A Episiotomy: None Lacerations: Bilateral Labial;Vaginal Patient had a postpartum course complicated by ***.  She is ambulating, tolerating a regular diet, passing flatus, and urinating well. Patient is discharged home in stable condition on 04/12/24.  Newborn Data: Birth date:04/12/2024 Birth time:5:20 PM Gender:Female Living status:Living Apgars:6 ,9  Weight:3410 g  Magnesium Sulfate received: No BMZ received: No Rhophylac:N/A MMR:{MMR:30440033} T-DaP:{Tdap:23962} Flu: {chl tdap given:23962} RSV Vaccine received: No Transfusion:No Immunizations administered: Immunization History  Administered Date(s) Administered   Influenza,inj,Quad PF,6+ Mos 06/30/2021    Recommend 6 weeks of prophylactic anticoagulation with LMWH or subcutaneous unfractionated heparin if 1 or more high risk factor is present.  Recommend 14 days of prophylactic anticoagulation with LMWH or subcutaneous unfractionated heparin if 3 or  more moderate risk factors are present.   Risk assessment for postpartum VTE and prophylactic treatment:  High risk factors: None Moderate risk factors: None  Postpartum VTE prophylaxis with LMWH not indicated   Physical exam  Vitals:   04/12/24 1809 04/12/24 1824 04/12/24 1825 04/12/24 1839  BP: 117/68 115/64 115/64 109/61  Pulse: (!) 115 (!) 104 (!) 104 96  Resp:      Temp:      TempSrc:      Weight:      Height:       General: {Exam; general:21111117} Lochia: {Desc; appropriate/inappropriate:30686::appropriate} Uterine Fundus: {Desc; firm/soft:30687} Laceration: *** DVT Evaluation: {Exam; dvt:2111122} Labs: Lab Results  Component Value Date   WBC 18.5 (H) 04/11/2024   HGB 10.3 (L) 04/11/2024   HCT 32.0 (L) 04/11/2024   MCV 85.3 04/11/2024   PLT 351 04/11/2024      Latest Ref Rng & Units 11/25/2023   12:26 AM  CMP  Total Protein 6.5 - 8.1 g/dL 6.9   Total Bilirubin 0.0 - 1.2 mg/dL 0.4   Alkaline Phos 38 - 126 U/L 44   AST 15 - 41 U/L 20   ALT 0 - 44 U/L 13    Edinburgh Score:    12/06/2023    2:31 PM  Edinburgh Postnatal Depression Scale Screening Tool  I have been able to laugh and see the funny side of things. 0  I have looked forward with enjoyment to things. 0  I have blamed myself unnecessarily when things went wrong. 2  I have been anxious or worried for no good reason. 2  I have felt scared or panicky for no good reason.  2  Things have been getting on top of me. 1  I have been so unhappy that I have had difficulty sleeping. 0  I have felt sad or miserable. 1  I have been so unhappy that I have been crying. 1  The thought of harming myself has occurred to me. 0  Edinburgh Postnatal Depression Scale Total 9      Data saved with a previous flowsheet row definition       After visit meds:  Allergies as of 04/12/2024   No Known Allergies   Med Rec must be completed prior to using this [facility]***        Discharge home in stable  condition Infant Feeding: Breast Infant Disposition:{CHL IP OB HOME WITH FNUYZM:76418} Discharge instruction: per After Visit Summary and Postpartum booklet. Activity: Advance as tolerated. Pelvic rest for 6 weeks.  Diet: routine diet Anticipated Birth Control: IUD Postpartum VTE prophyhlaxis: Not Indicated Postpartum Appointment:2 weeks Additional Postpartum F/U: Postpartum Depression checkup Future Appointments:No future appointments. Follow up Visit:      04/12/2024 Lauraine Lakes, CNM

## 2024-04-12 NOTE — Progress Notes (Signed)
 Kristina English is a 23 y.o. G1P0 at [redacted]w[redacted]d by LMP admitted for spontaneous labor.  Subjective: Kristina English is doing well overall, she is resting with the peanut ball. Having to breath through contractions.   Objective: BP 108/61 (BP Location: Right Arm)   Pulse (!) 103   Temp 98.1 F (36.7 C) (Oral)   Resp 18   Ht 4' 11 (1.499 m)   Wt 68 kg   LMP 07/04/2023   BMI 30.30 kg/m  No intake/output data recorded. No intake/output data recorded.  FHT:  FHR: 130 bpm, variability: moderate,  accelerations:  Present,  decelerations:  Absent UC:   regular, every 1-2  minutes SVE:   Dilation: 6 Effacement (%): 80 Station: -2 Exam by:: [employer]: Lab Results  Component Value Date   WBC 18.5 (H) 04/11/2024   HGB 10.3 (L) 04/11/2024   HCT 32.0 (L) 04/11/2024   MCV 85.3 04/11/2024   PLT 351 04/11/2024    Assessment / Plan: Spontaneous labor, active labor  Labor: Minimal change after AROM, contractions are closer together. Discussed potential use of pitocin,  Fetal Wellbeing:  Category I Pain Control:  Labor support without medications I/D:  GBS neg, ROM @ 0308 Anticipated MOD:  NSVD  Rosina Hamilton, Student-MidWife 04/12/2024, 8:05 AM

## 2024-04-13 LAB — CBC
HCT: 28.5 % — ABNORMAL LOW (ref 36.0–46.0)
Hemoglobin: 9.1 g/dL — ABNORMAL LOW (ref 12.0–15.0)
MCH: 27.6 pg (ref 26.0–34.0)
MCHC: 31.9 g/dL (ref 30.0–36.0)
MCV: 86.4 fL (ref 80.0–100.0)
Platelets: 312 K/uL (ref 150–400)
RBC: 3.3 MIL/uL — ABNORMAL LOW (ref 3.87–5.11)
RDW: 13.8 % (ref 11.5–15.5)
WBC: 25.6 K/uL — ABNORMAL HIGH (ref 4.0–10.5)
nRBC: 0 % (ref 0.0–0.2)

## 2024-04-13 NOTE — Progress Notes (Signed)
 [facility] Subjective:  Doing well postpartum day 1; tolerating regular diet, pain is controlled with PO medications, ambulating and voiding without difficulty. She reports breastfeeding is going well.  Objective:  Vital signs in last 24 hours: Temp:  [97.9 F (36.6 C)-99 F (37.2 C)] 99 F (37.2 C) (10/18 1607) Pulse Rate:  [84-117] 98 (10/18 1607) Resp:  [18-20] 18 (10/18 1607) BP: (98-113)/(46-72) 98/62 (10/18 1607) SpO2:  [98 %-99 %] 99 % (10/18 0750)    General: NAD Pulmonary: no increased work of breathing Abdomen: non-distended, non-tender, fundus firm at level of umbilicus Extremities: no edema, no erythema, no tenderness  Results for orders placed or performed during the hospital encounter of 04/11/24 (from the past 72 hours)  Type and screen     Status: None   Collection Time: 04/11/24  2:36 PM  Result Value Ref Range   ABO/RH(D) A POS    Antibody Screen NEG    Sample Expiration      04/14/2024,2359 Performed at [facility], [address] [place], [place] [address]   CBC     Status: Abnormal   Collection Time: 04/11/24  2:37 PM  Result Value Ref Range   WBC 18.5 (H) 4.0 - 10.5 K/uL   RBC 3.75 (L) 3.87 - 5.11 MIL/uL   Hemoglobin 10.3 (L) 12.0 - 15.0 g/dL   HCT 67.9 (L) 63.9 - 53.9 %   MCV 85.3 80.0 - 100.0 fL   MCH 27.5 26.0 - 34.0 pg   MCHC 32.2 30.0 - 36.0 g/dL   RDW 86.6 88.4 - 84.4 %   Platelets 351 150 - 400 K/uL   nRBC 0.0 0.0 - 0.2 %    Comment: Performed at [facility], [address] [place], [place] [address]  RPR     Status: None   Collection Time: 04/11/24  2:37 PM  Result Value Ref Range   RPR Ser Ql NON REACTIVE NON REACTIVE    Comment: Performed at [facility], [address] [place], [place] [address]  ABO/Rh     Status: None   Collection Time: 04/11/24  4:05 PM  Result Value Ref Range   ABO/RH(D)      A POS Performed at [facility], [address] [place], [place] [address]   CBC      Status: Abnormal   Collection Time: 04/13/24  7:29 AM  Result Value Ref Range   WBC 25.6 (H) 4.0 - 10.5 K/uL   RBC 3.30 (L) 3.87 - 5.11 MIL/uL   Hemoglobin 9.1 (L) 12.0 - 15.0 g/dL   HCT 71.4 (L) 63.9 - 53.9 %   MCV 86.4 80.0 - 100.0 fL   MCH 27.6 26.0 - 34.0 pg   MCHC 31.9 30.0 - 36.0 g/dL   RDW 86.1 88.4 - 84.4 %   Platelets 312 150 - 400 K/uL   nRBC 0.0 0.0 - 0.2 %    Comment: Performed at [facility], [address], [place], [place] [address]    Assessment:   23 y.o. G1P1001 postpartum day # 1, lactating  Plan:    1) Acute blood loss anemia - hemodynamically stable and asymptomatic Not clinically significant for this admission - po ferrous sulfate  2) Blood Type --/--/A POS Performed at [facility], [address] [place], [place] [address]  [phone]) / Rubella Nonimmune (07/29 0000) / Varicella Non-Immune, vaccinations declined  3) TDAP status declines  4) Feeding plan breast  5)  Education given regarding options for contraception, as well as compatibility with breast feeding if applicable.  Patient plans on abstinence for contraception.  6) Disposition: continue current care   Slater Rains, CNM [facility] 04/13/2024 6:35 PM

## 2024-04-13 NOTE — Lactation Note (Signed)
 This note was copied from a baby's chart. Lactation Consultation Note  Patient Name: Kristina English Date: 04/13/2024 Age:21 hours Reason for consult: Follow-up assessment;Primapara   Maternal Data    Feeding Mother's Current Feeding Choice: Breast Milk Observed baby breast feeding at right breast in cradle hold, audible swallows present, nursed 10 min and mom to offer left breast.   LATCH Score Latch: Grasps breast easily, tongue down, lips flanged, rhythmical sucking.  Audible Swallowing: Spontaneous and intermittent  Type of Nipple: Everted at rest and after stimulation  Comfort (Breast/Nipple): Soft / non-tender  Hold (Positioning): No assistance needed to correctly position infant at breast.  LATCH Score: 10   Lactation Tools Discussed/Used    Interventions Interventions: Education  Discharge Discharge Education: Warning signs for feeding baby  Consult Status Consult Status: PRN    Aldona JONETTA Converse 04/13/2024, 4:44 PM

## 2024-04-13 NOTE — Lactation Note (Signed)
 This note was copied from a baby's chart. Lactation Consultation Note  Patient Name: Kristina English Date: 04/13/2024 Age:21 hours Reason for consult: Initial assessment;Primapara;Term   Maternal Data Has patient been taught Hand Expression?: Yes Does the patient have breastfeeding experience prior to this delivery?: No  Feeding Mother's Current Feeding Choice: Breast Milk Baby rooting in bassinet, mom states she has more difficulty latching to breast on right, recently fed on left and states she can easily express much colostrum on right, mom shown how to  express colostrum on right, able to express many drops, baby able to latch well to right breast in football hold and began nursing well with audible swallows noted, has questions about what brand of formula to use if needed, I informed her that we use Similac formula here, may consult with her pediatrician. She does not plan on offering formula currently.      LATCH Score Latch: Grasps breast easily, tongue down, lips flanged, rhythmical sucking.  Audible Swallowing: Spontaneous and intermittent  Type of Nipple: Everted at rest and after stimulation  Comfort (Breast/Nipple): Soft / non-tender  Hold (Positioning): Assistance needed to correctly position infant at breast and maintain latch.  LATCH Score: 9   Lactation Tools Discussed/Used    Interventions Interventions: Breast feeding basics reviewed;Assisted with latch;Skin to skin;Hand express;Adjust position;Support pillows;Coconut oil;Education LC naem and no written on white board. Discharge Discharge Education: Engorgement and breast care Pump: Personal WIC Program: Yes  Consult Status Consult Status: PRN    Kristina English 04/13/2024, 12:21 PM

## 2024-04-14 NOTE — Progress Notes (Signed)
 Discharge instructions reviewed w/ pt. All questions answered. Pt discharged out of system but will be rooming in with baby as infant under Billi lights.

## 2024-04-14 NOTE — Lactation Note (Signed)
 This note was copied from a baby's chart. Lactation Consultation Note  Patient Name: Kristina English Date: 04/14/2024 Age:21 hours Reason for consult: Follow-up assessment;Term;Primapara;Other (Comment) (Baby receiving phototherapy treatment.)   Maternal Data This is mom's 1st baby, SVD. No lactation risk factors identified at this time.  On follow-up baby is receiving phototherapy treatment. Mom reports she is breastfeeding and offering formula supplement and baby is taking both. Mom with compression stripes on both nipples that mom describes as very painful. Per mom's baby's lips are rolled outward. Discussed with mom to insure baby is actively sucking and swallowing at the breast and not sucking non-nutritively . Per mom she does think at times baby is falling asleep at the breast pacifying.  Has patient been taught Hand Expression?: Yes Does the patient have breastfeeding experience prior to this delivery?: No  Feeding Mother's Current Feeding Choice: Breast Milk and Formula Provided tips and strategies to encourage baby to actively feed when at the breast. Provided mom with cool gels and educated on proper use. For tonight recommended mom use the football hold to provide a different way in which baby creates the pull and vacuum . Mom has been using cradle hold position.   Interventions Interventions: Breast feeding basics reviewed;Education;Comfort gels  Discharge Discharge Education: Engorgement and breast care;Warning signs for feeding baby;Outpatient recommendation Pump: Personal  Consult Status Consult Status: Follow-up Date: 04/15/24 Follow-up type: In-patient  Update provided to care nurse.  Avelina DELENA Gaskins 04/14/2024, 5:43 PM

## 2024-04-15 NOTE — Lactation Note (Signed)
 This note was copied from a baby's chart. Lactation Consultation Note  Patient Name: Kristina English Date: 04/15/2024 Age:21 hours Reason for consult: Follow-up assessment;Hyperbilirubinemia;Primapara   Maternal Data Follow up assessment w/ infant and mom.  Mom stated that her breast were sore and that her rt breast had a little bit of damage.  Feeding Mother's Current Feeding Choice: Breast Milk and Formula  Interventions Interventions: Ice;Education  LC assessed moms breast.  Mom is engorged. Ice was provided for both breast.  Demonstrated on how to do breast gymnastics to help with movement of milk but encouraged to be gentle with breast. LC demonstrated how to do hand expression and use own milk to help heal damaged nipples.    Discharge Discharge Education: Outpatient recommendation;Engorgement and breast care Education on engorgement prevention/treatment was discussed as well as breastmilk storage guidelines.  LC provided patient with a handout on breastmilk storage guidelines from [facility]. [facility] outpatient lactation services phone number written on the white board in the room.  Patient verbalized understanding.  Consult Status Consult Status: Complete Follow-up type: Call as needed    Ricky RAMAN Rohini Jaroszewski 04/15/2024, 12:07 PM

## 2024-08-02 ENCOUNTER — Encounter: Payer: Self-pay | Admitting: Certified Nurse Midwife

## 2024-08-06 ENCOUNTER — Ambulatory Visit: Admitting: Registered Nurse

## 2024-08-08 ENCOUNTER — Ambulatory Visit: Admitting: Certified Nurse Midwife

## 2024-09-11 ENCOUNTER — Ambulatory Visit: Admitting: Certified Nurse Midwife
